# Patient Record
Sex: Male | Born: 1990 | Race: White | Hispanic: No | Marital: Single | Smoking: Current every day smoker
Health system: Southern US, Community
[De-identification: ages and names within clinical notes are randomized; demographics above are authoritative.]

## PROBLEM LIST (undated history)

## (undated) DIAGNOSIS — R569 Unspecified convulsions: Secondary | ICD-10-CM

## (undated) DIAGNOSIS — I499 Cardiac arrhythmia, unspecified: Secondary | ICD-10-CM

## (undated) DIAGNOSIS — J45909 Unspecified asthma, uncomplicated: Secondary | ICD-10-CM

## (undated) DIAGNOSIS — F329 Major depressive disorder, single episode, unspecified: Secondary | ICD-10-CM

## (undated) DIAGNOSIS — F419 Anxiety disorder, unspecified: Secondary | ICD-10-CM

## (undated) DIAGNOSIS — F32A Depression, unspecified: Secondary | ICD-10-CM

---

## 2013-12-13 ENCOUNTER — Encounter (HOSPITAL_COMMUNITY): Payer: Self-pay | Admitting: Emergency Medicine

## 2013-12-13 ENCOUNTER — Emergency Department (HOSPITAL_COMMUNITY)
Admission: EM | Admit: 2013-12-13 | Discharge: 2013-12-13 | Disposition: A | Discharge status: Home / Self Care | Payer: Self-pay | Attending: Emergency Medicine | Admitting: Emergency Medicine

## 2013-12-13 DIAGNOSIS — Y9289 Other specified places as the place of occurrence of the external cause: Secondary | ICD-10-CM | POA: Insufficient documentation

## 2013-12-13 DIAGNOSIS — T1512XA Foreign body in conjunctival sac, left eye, initial encounter: Secondary | ICD-10-CM | POA: Insufficient documentation

## 2013-12-13 DIAGNOSIS — Y9389 Activity, other specified: Secondary | ICD-10-CM | POA: Insufficient documentation

## 2013-12-13 DIAGNOSIS — Z720 Tobacco use: Secondary | ICD-10-CM | POA: Insufficient documentation

## 2013-12-13 DIAGNOSIS — S00252A Superficial foreign body of left eyelid and periocular area, initial encounter: Secondary | ICD-10-CM

## 2013-12-13 MED ORDER — HYDROCODONE-ACETAMINOPHEN 5-325 MG PO TABS: 1.0000 | ORAL_TABLET | Freq: Four times a day (QID) | ORAL | Status: DC | PRN

## 2013-12-13 MED ORDER — FLUORESCEIN SODIUM 1 MG OP STRP
1.0000 | ORAL_STRIP | Freq: Once | OPHTHALMIC | Status: AC
  Administered 2013-12-13: 1 via OPHTHALMIC
  Filled 2013-12-13: qty 1

## 2013-12-13 MED ORDER — SULFACETAMIDE SODIUM 10 % OP SOLN: 2.0000 [drp] | OPHTHALMIC | Status: AC

## 2013-12-13 MED ORDER — TETRACAINE HCL 0.5 % OP SOLN
2.0000 [drp] | Freq: Once | OPHTHALMIC | Status: AC
  Administered 2013-12-13: 2 [drp] via OPHTHALMIC
  Filled 2013-12-13: qty 2

## 2013-12-13 NOTE — Discharge Instructions (Signed)
Return here as needed. Follow up with the eye doctor provided.  °

## 2013-12-13 NOTE — ED Notes (Signed)
Riding in pick up truck and something flew into his left eye. Eye is red and irritated.

## 2013-12-13 NOTE — ED Provider Notes (Signed)
CSN: [id number]     Arrival date & time 12/13/13  1725 History  This chart was scribed for non-physician practitioner, Ebbie Ridgehris Tereso Unangst, PA-C working with Raeford RazorStephen Kohut, MD by Greggory StallionKayla Andersen, ED scribe. This patient was seen in room TR04C/TR04C and the patient's care was started at 5:57 PM.   Chief Complaint  Patient presents with  . Foreign Body in Eye   The history is provided by the patient. No language interpreter was used.   HPI Comments: Melvin Barry is a 23 y.o. male who presents to the Emergency Department complaining of sudden onset left eye pain and redness that started a few hours ago. States he was riding in a pickup truck when what he thinks was a piece of dirt flew into his eye. Reports gradual onset haziness in his vision but denies other visual changes. Looking down worsens the eye pain. Pt has tried rinsing his eye out with no relief. Denies photophobia. Pt does not wear contacts or glasses.   History reviewed. No pertinent past medical history. History reviewed. No pertinent past surgical history. No family history on file. History  Substance Use Topics  . Smoking status: Current Every Day Smoker -- 1.00 packs/day    Types: Cigarettes  . Smokeless tobacco: Not on file  . Alcohol Use: No     Comment: socially    Review of Systems All other systems negative except as documented in the HPI. All pertinent positives and negatives as reviewed in the HPI.  Allergies  Review of patient's allergies indicates no known allergies.  Home Medications   Prior to Admission medications   Not on File   BP 140/72  Pulse 88  Temp(Src) 98.2 F (36.8 C) (Oral)  Resp 18  Ht 5\' 9"  (1.753 m)  Wt 160 lb (72.576 kg)  BMI 23.62 kg/m2  SpO2 98%  Physical Exam  Nursing note and vitals reviewed. Constitutional: He is oriented to person, place, and time. He appears well-developed and well-nourished. No distress.  HENT:  Head: Normocephalic and atraumatic.  Eyes: EOM are normal.  Right conjunctiva is injected.  Right eyelid inverted; foreign body found and removed. Small area of irritation centrally over cornea.   Neck: Neck supple. No tracheal deviation present.  Cardiovascular: Normal rate.   Pulmonary/Chest: Effort normal. No respiratory distress.  Musculoskeletal: Normal range of motion.  Neurological: He is alert and oriented to person, place, and time.  Skin: Skin is warm and dry.  Psychiatric: He has a normal mood and affect. His behavior is normal.    ED Course  Procedures (including critical care time)  DIAGNOSTIC STUDIES: Oxygen Saturation is 98% on RA, normal by my interpretation.    COORDINATION OF CARE: 5:59 PM-Discussed treatment plan which includes checking for corneal abrasion and irrigating eye with pt at bedside and pt agreed to plan. Will discharge pt home with eyedrops and ophthalmology referral.     I personally performed the services described in this documentation, which was scribed in my presence. The recorded information has been reviewed and is accurate.  Carlyle Dollyhristopher W Nimrat Woolworth, PA-C 12/13/13 1846

## 2013-12-16 NOTE — ED Provider Notes (Signed)
Medical screening examination/treatment/procedure(s) were performed by non-physician practitioner and as supervising physician I was immediately available for consultation/collaboration.   EKG Interpretation None       Raeford RazorStephen Dennie Moltz, MD 12/16/13 1857

## 2014-05-20 ENCOUNTER — Emergency Department (HOSPITAL_COMMUNITY): Payer: Self-pay

## 2014-05-20 ENCOUNTER — Encounter (HOSPITAL_COMMUNITY): Payer: Self-pay

## 2014-05-20 ENCOUNTER — Emergency Department (HOSPITAL_COMMUNITY)
Admission: EM | Admit: 2014-05-20 | Discharge: 2014-05-21 | Disposition: A | Discharge status: Home / Self Care | Payer: Self-pay | Attending: Emergency Medicine | Admitting: Emergency Medicine

## 2014-05-20 DIAGNOSIS — F10920 Alcohol use, unspecified with intoxication, uncomplicated: Secondary | ICD-10-CM

## 2014-05-20 DIAGNOSIS — J45909 Unspecified asthma, uncomplicated: Secondary | ICD-10-CM | POA: Insufficient documentation

## 2014-05-20 DIAGNOSIS — R55 Syncope and collapse: Secondary | ICD-10-CM

## 2014-05-20 DIAGNOSIS — F10120 Alcohol abuse with intoxication, uncomplicated: Secondary | ICD-10-CM | POA: Insufficient documentation

## 2014-05-20 DIAGNOSIS — Y998 Other external cause status: Secondary | ICD-10-CM | POA: Insufficient documentation

## 2014-05-20 DIAGNOSIS — Z720 Tobacco use: Secondary | ICD-10-CM | POA: Insufficient documentation

## 2014-05-20 DIAGNOSIS — Y9389 Activity, other specified: Secondary | ICD-10-CM | POA: Insufficient documentation

## 2014-05-20 DIAGNOSIS — W1839XA Other fall on same level, initial encounter: Secondary | ICD-10-CM | POA: Insufficient documentation

## 2014-05-20 DIAGNOSIS — Z8669 Personal history of other diseases of the nervous system and sense organs: Secondary | ICD-10-CM | POA: Insufficient documentation

## 2014-05-20 DIAGNOSIS — Y92009 Unspecified place in unspecified non-institutional (private) residence as the place of occurrence of the external cause: Secondary | ICD-10-CM | POA: Insufficient documentation

## 2014-05-20 DIAGNOSIS — S299XXA Unspecified injury of thorax, initial encounter: Secondary | ICD-10-CM | POA: Insufficient documentation

## 2014-05-20 DIAGNOSIS — T1490XA Injury, unspecified, initial encounter: Secondary | ICD-10-CM

## 2014-05-20 HISTORY — DX: Cardiac arrhythmia, unspecified: I49.9

## 2014-05-20 HISTORY — DX: Unspecified convulsions: R56.9

## 2014-05-20 HISTORY — DX: Unspecified asthma, uncomplicated: J45.909

## 2014-05-20 LAB — I-STAT CHEM 8, ED
BUN: 11 mg/dL (ref 6–23)
Calcium, Ion: 1.06 mmol/L — ABNORMAL LOW (ref 1.12–1.23)
Chloride: 106 mmol/L (ref 96–112)
Creatinine, Ser: 1.2 mg/dL (ref 0.50–1.35)
GLUCOSE: 91 mg/dL (ref 70–99)
HCT: 48 % (ref 39.0–52.0)
Hemoglobin: 16.3 g/dL (ref 13.0–17.0)
Potassium: 4 mmol/L (ref 3.5–5.1)
Sodium: 142 mmol/L (ref 135–145)
TCO2: 18 mmol/L (ref 0–100)

## 2014-05-20 MED ORDER — IBUPROFEN 800 MG PO TABS
800.0000 mg | ORAL_TABLET | Freq: Once | ORAL | Status: AC
  Administered 2014-05-20: 800 mg via ORAL
  Filled 2014-05-20: qty 1

## 2014-05-20 MED ORDER — ACETAMINOPHEN 325 MG PO TABS
650.0000 mg | ORAL_TABLET | Freq: Once | ORAL | Status: AC
  Administered 2014-05-20: 650 mg via ORAL
  Filled 2014-05-20: qty 2

## 2014-05-20 NOTE — ED Provider Notes (Signed)
CSN: [id number]     Arrival date & time    History   First MD Initiated Contact with Patient 05/20/14 2111     Chief Complaint  Patient presents with  . Loss of Consciousness     (Consider location/radiation/quality/duration/timing/severity/associated sxs/prior Treatment) HPI Comments: 24 year old male, history of asthma, history of a single seizure proximally 5 years ago, presents to the hospital by paramedics after he was found to have had a syncopal episode at home while on his deck, states that he went outside to get his dog, the next thing he remembers the paramedics were standing over him and a family member was attempting CPR. He very politely asked the person to stop doing CPR and had a normal mental status at that time. He complains of right-sided chest pain where his friend or family member was performing CPR, he does not have a headache, he has no other complaints. He does state that he has been drinking alcohol heavily tonight.  He denies any prodromal symptoms including palpitations, chest pain, difficulty breathing, blurred vision, weakness, numbness and has had no fevers chills or stiff neck.  Patient is a 24 y.o. male presenting with syncope. The history is provided by the patient and the EMS personnel.  Loss of Consciousness   No past medical history on file. No past surgical history on file. No family history on file. History  Substance Use Topics  . Smoking status: Current Every Day Smoker -- 1.00 packs/day    Types: Cigarettes  . Smokeless tobacco: Not on file  . Alcohol Use: No     Comment: socially    Review of Systems  Cardiovascular: Positive for syncope.  All other systems reviewed and are negative.     Allergies  Review of patient's allergies indicates no known allergies.  Home Medications   Prior to Admission medications   Medication Sig Start Date End Date Taking? Authorizing Provider  HYDROcodone-acetaminophen (NORCO/VICODIN) 5-325 MG per tablet  Take 1 tablet by mouth every 6 (six) hours as needed for moderate pain. 12/13/13   Charlestine Night, PA-C   There were no vitals taken for this visit. Physical Exam  Constitutional: He appears well-developed and well-nourished. No distress.  HENT:  Head: Normocephalic and atraumatic.  Mouth/Throat: Oropharynx is clear and moist. No oropharyngeal exudate.  Normocephalic, atraumatic, clear oropharynx without any swelling, exudate, asymmetry, hypertrophy or erythema.  Moist mucous membranes, clear nasal passages, normal tympanic membranes without any effusions, erythema or opacification.  Eyes: Conjunctivae and EOM are normal. Pupils are equal, round, and reactive to light. Right eye exhibits no discharge. Left eye exhibits no discharge. No scleral icterus.  Neck: Normal range of motion. Neck supple. No JVD present. No thyromegaly present.  Cardiovascular: Normal rate, regular rhythm, normal heart sounds and intact distal pulses.  Exam reveals no gallop and no friction rub.   No murmur heard. Pulmonary/Chest: Effort normal and breath sounds normal. No respiratory distress. He has no wheezes. He has no rales. He exhibits tenderness (no bruising crepitance or subcutaneous emphysema though he does have tenderness over the right lower anterior chest wall).  Abdominal: Soft. Bowel sounds are normal. He exhibits no distension and no mass. There is no tenderness.  Musculoskeletal: Normal range of motion. He exhibits no edema or tenderness.  Speech is clear, cranial nerves III through XII are intact, memory is intact, strength is normal in all 4 extremities including grips, sensation is intact to light touch and pinprick in all 4 extremities. Coordination as tested  by finger-nose-finger is normal, no limb ataxia. Normal gait, normal reflexes at the patellar tendons bilaterally  Lymphadenopathy:    He has no cervical adenopathy.  Neurological: He is alert. Coordination normal.  Skin: Skin is warm and dry.  No rash noted. No erythema.  Psychiatric: He has a normal mood and affect. His behavior is normal.  Nursing note and vitals reviewed.   ED Course  Procedures (including critical care time) Labs Review Labs Reviewed  ETHANOL  I-STAT CHEM 8, ED    Imaging Review No results found.  ED ECG REPORT  I personally interpreted this EKG   Date: 05/20/2014   Rate: 94  Rhythm: normal sinus rhythm  QRS Axis: normal  Intervals: normal  ST/T Wave abnormalities: normal  Conduction Disutrbances:none  Narrative Interpretation:   Old EKG Reviewed: none available   MDM   Final diagnoses:  Syncope and collapse  Trauma    At this time the patient appears to have had some kind of syncopal episode, the etiology is unclear, he will need CT scan imaging, labs and cardiac monitoring. EKG as well, he now has a normal mental status and is laughing about how much alcohol he has consumed this evening.  The patient has been continuously sleeping, he is easily to arouse, moves all 4 extremities without difficulty, vital signs remained in a stable range. There is no hypoxia tachycardia or fever. His friend and roommate now arrives and states that he was there when the patient was having difficulty at home, states that he lost all control of his body and collapse to the ground, was unconscious, he tried to give an albuterol treatment because the patient had said that he was shortness of breath prior to falling, he states that this has happened once in the past when the patient had a seizure as a child though this was only reported by the patient and not witnessed by any friends or family. Alcohol level pending, the patient will need reevaluation after he comes around and sobers up,  Labs show no acute findings thus far, x-ray show no rib fractures, no brain injury, no cervical spine fracture.  Will need tos ober up - be reevaluated by Dr. Deretha EmoryZackowski who will assume care at change of shift     Eber HongBrian Zulema Pulaski,  MD 05/20/14 2350

## 2014-05-20 NOTE — ED Notes (Signed)
MD at bedside. 

## 2014-05-20 NOTE — ED Notes (Signed)
PER EMS:pt from home, family reports the pt suddenly fell out and onto porch and was pulseless according to his gf and she initiated CPR. Pt was A&OX4 upon EMS arrival and upon arrival to ER. Pt reports right rib cage/upper chest pain. No crepitus. Pt reports ETOH (6 drinks and one shot of tequila). Hx of seizures. BP-134/81, HR-102, 99% RA, CBG-89.

## 2014-05-21 LAB — ETHANOL: Alcohol, Ethyl (B): 211 mg/dL — ABNORMAL HIGH (ref 0–9)

## 2014-05-21 MED ORDER — IPRATROPIUM-ALBUTEROL 0.5-2.5 (3) MG/3ML IN SOLN
3.0000 mL | Freq: Once | RESPIRATORY_TRACT | Status: AC
  Administered 2014-05-21: 3 mL via RESPIRATORY_TRACT
  Filled 2014-05-21: qty 3

## 2014-05-21 MED ORDER — ALBUTEROL SULFATE HFA 108 (90 BASE) MCG/ACT IN AERS
1.0000 | INHALATION_SPRAY | Freq: Four times a day (QID) | RESPIRATORY_TRACT | Status: DC | PRN
  Administered 2014-05-21: 1 via RESPIRATORY_TRACT
  Filled 2014-05-21: qty 6.7

## 2014-05-21 MED ORDER — ACETAMINOPHEN 325 MG PO TABS
650.0000 mg | ORAL_TABLET | Freq: Once | ORAL | Status: AC
  Administered 2014-05-21: 650 mg via ORAL
  Filled 2014-05-21: qty 2

## 2014-05-21 NOTE — Discharge Instructions (Signed)
Use albuterol inhaler 1-2 puffs every 6 hours as needed. Return for any new or worse symptoms.

## 2014-05-21 NOTE — ED Provider Notes (Signed)
The patient now awake very functional. Patient in no acute distress nontoxic. Patient did develop some wheezing was given albuterol Atrovent nebulizer wheezing resolved. Patient we discharged home with albuterol inhaler. Patient does use inhaler as needed however the one he had was lost.  Vanetta MuldersScott Ruthia Person, MD 05/21/14 [phone]

## 2014-05-21 NOTE — ED Notes (Signed)
Pt complaining of wheezing and continued difficulty with breathing, R sided wheezing present in the bases upon examination. States EMS lost his inhaler. Short with RN. Unhooked from monitor and encouraged to ambulate.

## 2014-07-07 ENCOUNTER — Emergency Department (HOSPITAL_COMMUNITY)
Admission: EM | Admit: 2014-07-07 | Discharge: 2014-07-07 | Disposition: A | Discharge status: Home / Self Care | Payer: Self-pay | Attending: Emergency Medicine | Admitting: Emergency Medicine

## 2014-07-07 ENCOUNTER — Encounter (HOSPITAL_COMMUNITY): Payer: Self-pay | Admitting: Emergency Medicine

## 2014-07-07 DIAGNOSIS — Z720 Tobacco use: Secondary | ICD-10-CM | POA: Insufficient documentation

## 2014-07-07 DIAGNOSIS — R569 Unspecified convulsions: Secondary | ICD-10-CM | POA: Insufficient documentation

## 2014-07-07 DIAGNOSIS — Z79899 Other long term (current) drug therapy: Secondary | ICD-10-CM | POA: Insufficient documentation

## 2014-07-07 DIAGNOSIS — F1210 Cannabis abuse, uncomplicated: Secondary | ICD-10-CM | POA: Insufficient documentation

## 2014-07-07 DIAGNOSIS — Z8679 Personal history of other diseases of the circulatory system: Secondary | ICD-10-CM | POA: Insufficient documentation

## 2014-07-07 DIAGNOSIS — J45909 Unspecified asthma, uncomplicated: Secondary | ICD-10-CM | POA: Insufficient documentation

## 2014-07-07 LAB — RAPID URINE DRUG SCREEN, HOSP PERFORMED
Amphetamines: NOT DETECTED
Barbiturates: NOT DETECTED
Benzodiazepines: NOT DETECTED
Cocaine: NOT DETECTED
OPIATES: NOT DETECTED
Tetrahydrocannabinol: POSITIVE — AB

## 2014-07-07 LAB — URINALYSIS, ROUTINE W REFLEX MICROSCOPIC
Bilirubin Urine: NEGATIVE
Glucose, UA: NEGATIVE mg/dL
HGB URINE DIPSTICK: NEGATIVE
Ketones, ur: NEGATIVE mg/dL
Leukocytes, UA: NEGATIVE
NITRITE: NEGATIVE
Protein, ur: NEGATIVE mg/dL
Specific Gravity, Urine: 1.017 (ref 1.005–1.030)
Urobilinogen, UA: 0.2 mg/dL (ref 0.0–1.0)
pH: 6 (ref 5.0–8.0)

## 2014-07-07 LAB — HEPATIC FUNCTION PANEL
ALT: 30 U/L (ref 17–63)
AST: 22 U/L (ref 15–41)
Albumin: 4.6 g/dL (ref 3.5–5.0)
Alkaline Phosphatase: 50 U/L (ref 38–126)
BILIRUBIN TOTAL: 0.6 mg/dL (ref 0.3–1.2)
Total Protein: 7.7 g/dL (ref 6.5–8.1)

## 2014-07-07 LAB — CBC
HEMATOCRIT: 45.5 % (ref 39.0–52.0)
HEMOGLOBIN: 15.5 g/dL (ref 13.0–17.0)
MCH: 30.9 pg (ref 26.0–34.0)
MCHC: 34.1 g/dL (ref 30.0–36.0)
MCV: 90.6 fL (ref 78.0–100.0)
PLATELETS: 223 10*3/uL (ref 150–400)
RBC: 5.02 MIL/uL (ref 4.22–5.81)
RDW: 12.7 % (ref 11.5–15.5)
WBC: 7.9 10*3/uL (ref 4.0–10.5)

## 2014-07-07 LAB — BASIC METABOLIC PANEL
ANION GAP: 10 (ref 5–15)
BUN: 12 mg/dL (ref 6–20)
CHLORIDE: 108 mmol/L (ref 101–111)
CO2: 23 mmol/L (ref 22–32)
Calcium: 9.1 mg/dL (ref 8.9–10.3)
Creatinine, Ser: 0.8 mg/dL (ref 0.61–1.24)
GFR calc Af Amer: >60 mL/min (ref >60)
GFR calc non Af Amer: >60 mL/min (ref >60)
Glucose, Bld: 87 mg/dL (ref 70–99)
Potassium: 4.1 mmol/L (ref 3.5–5.1)
Sodium: 141 mmol/L (ref 135–145)

## 2014-07-07 LAB — CBG MONITORING, ED: Glucose-Capillary: 85 mg/dL (ref 70–99)

## 2014-07-07 MED ORDER — SODIUM CHLORIDE 0.9 % IV BOLUS (SEPSIS)
1000.0000 mL | Freq: Once | INTRAVENOUS | Status: AC
  Administered 2014-07-07: 1000 mL via INTRAVENOUS

## 2014-07-07 MED ORDER — LEVETIRACETAM 500 MG PO TABS: 500.0000 mg | ORAL_TABLET | Freq: Two times a day (BID) | ORAL | Status: DC

## 2014-07-07 MED ORDER — KETOROLAC TROMETHAMINE 30 MG/ML IJ SOLN
30.0000 mg | Freq: Once | INTRAMUSCULAR | Status: AC
  Administered 2014-07-07: 30 mg via INTRAVENOUS
  Filled 2014-07-07: qty 1

## 2014-07-07 NOTE — ED Notes (Signed)
Pt was given a urinal and told we needed a sample. Pt stated he does not have to go at this time, but would try shortly.

## 2014-07-07 NOTE — ED Provider Notes (Addendum)
TIME SEEN: 10:50 AM  CHIEF COMPLAINT: Possible seizure  HPI: Pt is a 24 y.o. male with history of childhood seizures who presents emergency department with a possible seizure today. States that he was in a house painting working by himself standing on the floor when he felt lightheaded and slightly short of breath.  Reports the next thing he remembered was that he woke up on the floor. Unclear how long he was on the floor. Denies any chest pain preceding this event. No one was with him when this happened. He was not on a ladder. Reports he woke up with a diffuse throbbing headache this morning but is still present. Reports that he takes Tylenol at home when he has these headaches in the past. No numbness, tingling or focal weakness. Not on anticoagulation. States he did drink alcohol last night but denies using alcohol every day or withdrawal seizures. No drug use. Reports his mother and brothers both have epilepsy. He is not on any antiepileptic. State he had a similar episode in March of this year when he was at a party. States that seizure activity was witnessed at that time. Denies any tongue biting or incontinence today. Denies chest pain or shortness of breath currently. No history of PE or DVT, recent prolonged immobilization such as long flight or hospitalization, fracture, surgery, trauma. No history of premature CAD in his family, hypertension, diabetes, hyperlipidemia.   ROS: See HPI Constitutional: no fever  Eyes: no drainage  ENT: no runny nose   Cardiovascular:  no chest pain  Resp: no SOB  GI: no vomiting GU: no dysuria Integumentary: no rash  Allergy: no hives  Musculoskeletal: no leg swelling  Neurological: no slurred speech ROS otherwise negative  PAST MEDICAL HISTORY/PAST SURGICAL HISTORY:  Past Medical History  Diagnosis Date  . Seizures   . Asthma   . Irregular heart beat     pt unsure what the irregular rhythm is    MEDICATIONS:  Prior to Admission medications    Medication Sig Start Date End Date Taking? Authorizing Provider  albuterol (PROVENTIL HFA;VENTOLIN HFA) 108 (90 BASE) MCG/ACT inhaler Inhale 2 puffs into the lungs every 6 (six) hours as needed for wheezing or shortness of breath.   Yes Historical Provider, MD  HYDROcodone-acetaminophen (NORCO/VICODIN) 5-325 MG per tablet Take 1 tablet by mouth every 6 (six) hours as needed for moderate pain. Patient not taking: Reported on 07/07/2014 12/13/13   Charlestine Nighthristopher Lawyer, PA-C    ALLERGIES:  No Known Allergies  SOCIAL HISTORY:  History  Substance Use Topics  . Smoking status: Current Every Day Smoker -- 1.00 packs/day    Types: Cigarettes  . Smokeless tobacco: Not on file  . Alcohol Use: Yes     Comment: socially    FAMILY HISTORY: No family history on file.  EXAM: BP 139/78 mmHg  Pulse 87  Temp(Src) 97.7 F (36.5 C) (Oral)  Resp 18  Ht 5\' 9"  (1.753 m)  Wt 175 lb (79.379 kg)  BMI 25.83 kg/m2  SpO2 99% CONSTITUTIONAL: Alert and oriented and responds appropriately to questions. Well-appearing; well-nourished HEAD: Normocephalic, atraumatic EYES: Conjunctivae clear, PERRL, photophobia ENT: normal nose; no rhinorrhea; moist mucous membranes; pharynx without lesions noted, no lesions on the tongue, no dental injury, no septal hematoma NECK: Supple, no meningismus, no LAD; no midline cervical spine tenderness or step-off or deformity CARD: RRR; S1 and S2 appreciated; no murmurs, no clicks, no rubs, no gallops RESP: Normal chest excursion without splinting or tachypnea; breath sounds  clear and equal bilaterally; no wheezes, no rhonchi, no rales, chest wall stable and nontender to palpation ABD/GI: Normal bowel sounds; non-distended; soft, non-tender, no rebound, no guarding BACK:  The back appears normal and is non-tender to palpation, there is no CVA tenderness, no midline spinal tenderness or step-off or deformity EXT: Normal ROM in all joints; non-tender to palpation; no edema; normal  capillary refill; no cyanosis    SKIN: Normal color for age and race; warm NEURO: Moves all extremities equally, sensation to light touch intact diffusely, cranial nerves II through XII intact PSYCH: The patient's mood and manner are appropriate. Grooming and personal hygiene are appropriate.  MEDICAL DECISION MAKING: Patient here with possible seizure versus syncope. EKG shows no ischemic changes. He does have a h/o sinus arrhythmia. No interval changes. Has a history of epilepsy as a child and his family has significant history. I suspect this was a seizure. Will check labs, urine. When he was here in March he had a normal head CT. Have discussed with patient at length that he will need to follow-up with a neurologist and likely be started on antiepileptics. Have also discussed with him that he cannot drive until he is cleared by a neurologist. 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  ED PROGRESS: Patient's workup today has been unremarkable. Normal labs and urine.  D/w Dr. Hosie PoissonSumner with neural hospitalist service who recommends placing the patient on Keppra 500 mg twice a day. I have found a coupon for this medication were he can get it out [facility] for $16. Have discussed with him again at length that he cannot drive until he has been cleared by neurologist. Discussed other seizure precautions. Given outpatient neurology follow-up. He verbalized understanding and is comfortable with this plan.     EKG Interpretation  Date/Time:  Wednesday Jul 07 2014 10:39:13 EDT Ventricular Rate:  85 PR Interval:  159 QRS Duration: 90 QT Interval:  347 QTC Calculation: 413 R Axis:   57 Text Interpretation:  Sinus rhythm Confirmed by ZACKOWSKI  MD, SCOTT ([id number]) on 07/07/2014 10:52:02 AM         Layla MawKristen N Ward, DO 07/07/14 1227   Pt's headache is gone after Toradol.  Layla MawKristen N Ward, DO 07/07/14 1238

## 2014-07-07 NOTE — ED Notes (Signed)
Bed: [id number] Expected date:  Expected time:  Means of arrival:  Comments: 24 y/o seizure

## 2014-07-07 NOTE — ED Notes (Addendum)
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 Pt reports headache but denies other symptoms at present time. Pt continues to reports hx of seizure a month and a half ago and assumes same today. Pt has not eaten today.

## 2014-07-07 NOTE — ED Notes (Signed)
Awake. Verbally responsive. A/O x4. Resp even and unlabored. No audible adventitious breath sounds noted. ABC's intact.  

## 2014-07-07 NOTE — Discharge Instructions (Signed)
Seizure, Adult °A seizure is abnormal electrical activity in the brain. Seizures usually last from 30 seconds to 2 minutes. There are various types of seizures. °Before a seizure, you may have a warning sensation (aura) that a seizure is about to occur. An aura may include the following symptoms:  °· Fear or anxiety. °· Nausea. °· Feeling like the room is spinning (vertigo). °· Vision changes, such as seeing flashing lights or spots. °Common symptoms during a seizure include: °· A change in attention or behavior (altered mental status). °· Convulsions with rhythmic jerking movements. °· Drooling. °· Rapid eye movements. °· Grunting. °· Loss of bladder and bowel control. °· Bitter taste in the mouth. °· Tongue biting. °After a seizure, you may feel confused and sleepy. You may also have an injury resulting from convulsions during the seizure. °HOME CARE INSTRUCTIONS  °· If you are given medicines, take them exactly as prescribed by your health care provider. °· Keep all follow-up appointments as directed by your health care provider. °· Do not swim or drive or engage in risky activity during which a seizure could cause further injury to you or others until your health care provider says it is OK. °· Get adequate rest. °· Teach friends and family what to do if you have a seizure. They should: °· Lay you on the ground to prevent a fall. °· Put a cushion under your head. °· Loosen any tight clothing around your neck. °· Turn you on your side. If vomiting occurs, this helps keep your airway clear. °· Stay with you until you recover. °· Know whether or not you need emergency care. °SEEK IMMEDIATE MEDICAL CARE IF: °· The seizure lasts longer than 5 minutes. °· The seizure is severe or you do not wake up immediately after the seizure. °· You have an altered mental status after the seizure. °· You are having more frequent or worsening seizures. °Someone should drive you to the emergency department or call local emergency  services (911 in [place] °MAKE SURE YOU: °· Understand these instructions. °· Will watch your condition. °· Will get help right away if you are not doing well or get worse. °Document Released: 02/17/2000 Document Revised: 12/10/2012 Document Reviewed: 10/01/2012 °[employer]® Patient Information ©2015 [employer]. This information is not intended to replace advice given to you by your health care provider. Make sure you discuss any questions you have with your health care provider. ° °Driving and Equipment Restrictions °Some medical problems make it dangerous to drive, ride a bike, or use machines. Some of these problems are: °· A hard blow to the head (concussion). °· Passing out (fainting). °· Twitching and shaking (seizures). °· Low blood sugar. °· Taking medicine to help you relax (sedatives). °· Taking pain medicines. °· Wearing an eye patch. °· Wearing splints. This can make it hard to use parts of your body that you need to drive safely. °HOME CARE  °· Do not drive until your doctor says it is okay. °· Do not use machines until your doctor says it is okay. °You may need a form signed by your doctor (medical release) before you can drive again. You may also need this form before you do other tasks where you need to be fully alert. °MAKE SURE YOU: °· Understand these instructions. °· Will watch your condition. °· Will get help right away if you are not doing well or get worse. °Document Released: 03/29/2004 Document Revised: 05/14/2011 Document Reviewed: 06/29/2009 °[employer]® Patient Information ©2015 [employer].   This information is not intended to replace advice given to you by your health care provider. Make sure you discuss any questions you have with your health care provider. ° °

## 2014-10-15 ENCOUNTER — Encounter (HOSPITAL_COMMUNITY): Payer: Self-pay | Admitting: Emergency Medicine

## 2014-10-15 ENCOUNTER — Emergency Department (HOSPITAL_COMMUNITY): Payer: Self-pay

## 2014-10-15 ENCOUNTER — Emergency Department (HOSPITAL_COMMUNITY)
Admission: EM | Admit: 2014-10-15 | Discharge: 2014-10-15 | Disposition: A | Discharge status: Home / Self Care | Payer: Self-pay | Attending: Emergency Medicine | Admitting: Emergency Medicine

## 2014-10-15 DIAGNOSIS — J45909 Unspecified asthma, uncomplicated: Secondary | ICD-10-CM | POA: Insufficient documentation

## 2014-10-15 DIAGNOSIS — Y9289 Other specified places as the place of occurrence of the external cause: Secondary | ICD-10-CM | POA: Insufficient documentation

## 2014-10-15 DIAGNOSIS — Z79899 Other long term (current) drug therapy: Secondary | ICD-10-CM | POA: Insufficient documentation

## 2014-10-15 DIAGNOSIS — Z23 Encounter for immunization: Secondary | ICD-10-CM | POA: Insufficient documentation

## 2014-10-15 DIAGNOSIS — Y998 Other external cause status: Secondary | ICD-10-CM | POA: Insufficient documentation

## 2014-10-15 DIAGNOSIS — G40909 Epilepsy, unspecified, not intractable, without status epilepticus: Secondary | ICD-10-CM | POA: Insufficient documentation

## 2014-10-15 DIAGNOSIS — Y9389 Activity, other specified: Secondary | ICD-10-CM | POA: Insufficient documentation

## 2014-10-15 DIAGNOSIS — S61411A Laceration without foreign body of right hand, initial encounter: Secondary | ICD-10-CM | POA: Insufficient documentation

## 2014-10-15 DIAGNOSIS — Z720 Tobacco use: Secondary | ICD-10-CM | POA: Insufficient documentation

## 2014-10-15 MED ORDER — CEPHALEXIN 250 MG PO CAPS
500.0000 mg | ORAL_CAPSULE | Freq: Once | ORAL | Status: AC
  Administered 2014-10-15: 500 mg via ORAL
  Filled 2014-10-15: qty 2

## 2014-10-15 MED ORDER — TETANUS-DIPHTH-ACELL PERTUSSIS 5-2.5-18.5 LF-MCG/0.5 IM SUSP
0.5000 mL | Freq: Once | INTRAMUSCULAR | Status: AC
  Administered 2014-10-15: 0.5 mL via INTRAMUSCULAR
  Filled 2014-10-15: qty 0.5

## 2014-10-15 MED ORDER — LIDOCAINE HCL (PF) 1 % IJ SOLN
5.0000 mL | Freq: Once | INTRAMUSCULAR | Status: AC
  Administered 2014-10-15: 5 mL via INTRADERMAL
  Filled 2014-10-15: qty 5

## 2014-10-15 MED ORDER — CEPHALEXIN 500 MG PO CAPS: 500.0000 mg | ORAL_CAPSULE | Freq: Three times a day (TID) | ORAL | Status: DC
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
## 2014-10-15 NOTE — Discharge Instructions (Signed)
Take ibuprofen as needed for pain. Follow up in 7 days for suture removal or sooner for problems.
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
## 2014-12-09 ENCOUNTER — Emergency Department (HOSPITAL_COMMUNITY)
Admission: EM | Admit: 2014-12-09 | Discharge: 2014-12-09 | Disposition: A | Discharge status: Home / Self Care | Payer: Self-pay | Attending: Emergency Medicine | Admitting: Emergency Medicine

## 2014-12-09 ENCOUNTER — Emergency Department (HOSPITAL_COMMUNITY): Payer: Self-pay

## 2014-12-09 ENCOUNTER — Encounter (HOSPITAL_COMMUNITY): Payer: Self-pay | Admitting: Emergency Medicine

## 2014-12-09 DIAGNOSIS — Y9389 Activity, other specified: Secondary | ICD-10-CM | POA: Insufficient documentation

## 2014-12-09 DIAGNOSIS — R040 Epistaxis: Secondary | ICD-10-CM | POA: Insufficient documentation

## 2014-12-09 DIAGNOSIS — S29001A Unspecified injury of muscle and tendon of front wall of thorax, initial encounter: Secondary | ICD-10-CM | POA: Insufficient documentation

## 2014-12-09 DIAGNOSIS — T07XXXA Unspecified multiple injuries, initial encounter: Secondary | ICD-10-CM

## 2014-12-09 DIAGNOSIS — T148 Other injury of unspecified body region: Secondary | ICD-10-CM | POA: Insufficient documentation

## 2014-12-09 DIAGNOSIS — Z720 Tobacco use: Secondary | ICD-10-CM | POA: Insufficient documentation

## 2014-12-09 DIAGNOSIS — Z79899 Other long term (current) drug therapy: Secondary | ICD-10-CM | POA: Insufficient documentation

## 2014-12-09 DIAGNOSIS — S4991XA Unspecified injury of right shoulder and upper arm, initial encounter: Secondary | ICD-10-CM | POA: Insufficient documentation

## 2014-12-09 DIAGNOSIS — S0993XA Unspecified injury of face, initial encounter: Secondary | ICD-10-CM | POA: Insufficient documentation

## 2014-12-09 DIAGNOSIS — J45909 Unspecified asthma, uncomplicated: Secondary | ICD-10-CM | POA: Insufficient documentation

## 2014-12-09 DIAGNOSIS — Y998 Other external cause status: Secondary | ICD-10-CM | POA: Insufficient documentation

## 2014-12-09 DIAGNOSIS — Y9289 Other specified places as the place of occurrence of the external cause: Secondary | ICD-10-CM | POA: Insufficient documentation

## 2014-12-09 DIAGNOSIS — G40909 Epilepsy, unspecified, not intractable, without status epilepticus: Secondary | ICD-10-CM | POA: Insufficient documentation

## 2014-12-09 MED ORDER — HYDROMORPHONE HCL 1 MG/ML IJ SOLN
1.0000 mg | Freq: Once | INTRAMUSCULAR | Status: AC
  Administered 2014-12-09: 1 mg via INTRAMUSCULAR
  Filled 2014-12-09: qty 1

## 2014-12-09 MED ORDER — IBUPROFEN 400 MG PO TABS
600.0000 mg | ORAL_TABLET | Freq: Once | ORAL | Status: AC
  Administered 2014-12-09: 600 mg via ORAL
  Filled 2014-12-09 (×2): qty 1

## 2014-12-09 MED ORDER — DIAZEPAM 5 MG PO TABS
5.0000 mg | ORAL_TABLET | Freq: Once | ORAL | Status: AC
  Administered 2014-12-09: 5 mg via ORAL
  Filled 2014-12-09: qty 1

## 2014-12-09 MED ORDER — OXYCODONE-ACETAMINOPHEN 5-325 MG PO TABS: 1.0000 | ORAL_TABLET | ORAL | Status: DC | PRN
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
## 2014-12-09 NOTE — ED Notes (Signed)
Dr. Gwendolyn Grant notified of pt and orders received.
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
## 2015-02-21 ENCOUNTER — Emergency Department (HOSPITAL_COMMUNITY): Payer: Self-pay

## 2015-02-21 ENCOUNTER — Emergency Department (HOSPITAL_COMMUNITY)
Admission: EM | Admit: 2015-02-21 | Discharge: 2015-02-22 | Disposition: A | Discharge status: Home / Self Care | Payer: Self-pay | Attending: Emergency Medicine | Admitting: Emergency Medicine

## 2015-02-21 ENCOUNTER — Encounter (HOSPITAL_COMMUNITY): Payer: Self-pay

## 2015-02-21 DIAGNOSIS — Y93E1 Activity, personal bathing and showering: Secondary | ICD-10-CM | POA: Insufficient documentation

## 2015-02-21 DIAGNOSIS — S29002A Unspecified injury of muscle and tendon of back wall of thorax, initial encounter: Secondary | ICD-10-CM | POA: Insufficient documentation

## 2015-02-21 DIAGNOSIS — J45909 Unspecified asthma, uncomplicated: Secondary | ICD-10-CM | POA: Insufficient documentation

## 2015-02-21 DIAGNOSIS — Z79899 Other long term (current) drug therapy: Secondary | ICD-10-CM | POA: Insufficient documentation

## 2015-02-21 DIAGNOSIS — F17210 Nicotine dependence, cigarettes, uncomplicated: Secondary | ICD-10-CM | POA: Insufficient documentation

## 2015-02-21 DIAGNOSIS — R569 Unspecified convulsions: Secondary | ICD-10-CM | POA: Insufficient documentation

## 2015-02-21 DIAGNOSIS — Z8679 Personal history of other diseases of the circulatory system: Secondary | ICD-10-CM | POA: Insufficient documentation

## 2015-02-21 DIAGNOSIS — W182XXA Fall in (into) shower or empty bathtub, initial encounter: Secondary | ICD-10-CM | POA: Insufficient documentation

## 2015-02-21 DIAGNOSIS — Y92091 Bathroom in other non-institutional residence as the place of occurrence of the external cause: Secondary | ICD-10-CM | POA: Insufficient documentation

## 2015-02-21 DIAGNOSIS — Y998 Other external cause status: Secondary | ICD-10-CM | POA: Insufficient documentation

## 2015-02-21 LAB — I-STAT CHEM 8, ED
BUN: 13 mg/dL (ref 6–20)
CREATININE: 1 mg/dL (ref 0.61–1.24)
Calcium, Ion: 1.09 mmol/L — ABNORMAL LOW (ref 1.12–1.23)
Chloride: 108 mmol/L (ref 101–111)
Glucose, Bld: 95 mg/dL (ref 65–99)
HEMATOCRIT: 44 % (ref 39.0–52.0)
HEMOGLOBIN: 15 g/dL (ref 13.0–17.0)
POTASSIUM: 3.8 mmol/L (ref 3.5–5.1)
SODIUM: 143 mmol/L (ref 135–145)
TCO2: 21 mmol/L (ref 0–100)

## 2015-02-21 LAB — CBG MONITORING, ED: GLUCOSE-CAPILLARY: 95 mg/dL (ref 65–99)

## 2015-02-21 MED ORDER — LORAZEPAM 2 MG/ML IJ SOLN
1.0000 mg | Freq: Once | INTRAMUSCULAR | Status: AC
  Administered 2015-02-21: 1 mg via INTRAVENOUS
  Filled 2015-02-21: qty 1

## 2015-02-21 MED ORDER — LEVETIRACETAM 500 MG PO TABS: 500.0000 mg | ORAL_TABLET | Freq: Two times a day (BID) | ORAL | Status: AC

## 2015-02-21 MED ORDER — ACETAMINOPHEN 325 MG PO TABS
650.0000 mg | ORAL_TABLET | Freq: Once | ORAL | Status: AC
  Administered 2015-02-21: 650 mg via ORAL
  Filled 2015-02-21: qty 2

## 2015-02-21 MED ORDER — LORAZEPAM 2 MG/ML IJ SOLN
1.0000 mg | Freq: Once | INTRAMUSCULAR | Status: AC
  Administered 2015-02-22: 1 mg via INTRAVENOUS
  Filled 2015-02-21: qty 1

## 2015-02-21 MED ORDER — SODIUM CHLORIDE 0.9 % IV SOLN
1000.0000 mg | Freq: Once | INTRAVENOUS | Status: AC
  Administered 2015-02-21: 1000 mg via INTRAVENOUS
  Filled 2015-02-21: qty 5
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
## 2015-02-21 NOTE — ED Notes (Signed)
Bed: WA06 Expected date:  Expected time:  Means of arrival:  Comments: EMS 24 yo male seizure/hx seizure-alert at present time

## 2015-02-21 NOTE — Discharge Instructions (Signed)
Epilepsy °Epilepsy is a disorder in which a person has repeated seizures over time. A seizure is a release of abnormal electrical activity in the brain. Seizures can cause a change in attention, behavior, or the ability to remain awake and alert (altered mental status). Seizures often involve uncontrollable shaking (convulsions).  °Most people with epilepsy lead normal lives. However, people with epilepsy are at an increased risk of falls, accidents, and injuries. Therefore, it is important to begin treatment right away. °CAUSES  °Epilepsy has many possible causes. Anything that disturbs the normal pattern of brain cell activity can lead to seizures. This may include:  °· Head injury. °· Birth trauma. °· High fever as a child. °· Stroke. °· Bleeding into or around the brain. °· Certain drugs. °· Prolonged low oxygen, such as what occurs after CPR efforts. °· Abnormal brain development. °· Certain illnesses, such as meningitis, encephalitis (brain infection), malaria, and other infections. °· An imbalance of nerve signaling chemicals (neurotransmitters).   °SIGNS AND SYMPTOMS  °The symptoms of a seizure can vary greatly from one person to another. Right before a seizure, you may have a warning (aura) that a seizure is about to occur. An aura may include the following symptoms: °· Fear or anxiety. °· Nausea. °· Feeling like the room is spinning (vertigo). °· Vision changes, such as seeing flashing lights or spots. °Common symptoms during a seizure include: °· Abnormal sensations, such as an abnormal smell or a bitter taste in the mouth.   °· Sudden, general body stiffness.   °· Convulsions that involve rhythmic jerking of the face, arm, or leg on one or both sides.   °· Sudden change in consciousness.   °¨ Appearing to be awake but not responding.   °¨ Appearing to be asleep but cannot be awakened.   °· Grimacing, chewing, lip smacking, drooling, tongue biting, or loss of bowel or bladder control. °After a seizure,  you may feel sleepy for a while.  °DIAGNOSIS  °Your health care provider will ask about your symptoms and take a medical history. Descriptions from any witnesses to your seizures will be very helpful in the diagnosis. A physical exam, including a detailed neurological exam, is necessary. Various tests may be done, such as:  °· An electroencephalogram (EEG). This is a painless test of your brain waves. In this test, a diagram is created of your brain waves. These diagrams can be interpreted by a specialist. °· An MRI of the brain.   °· A CT scan of the brain.   °· A spinal tap (lumbar puncture, LP). °· Blood tests to check for signs of infection or abnormal blood chemistry. °TREATMENT  °There is no cure for epilepsy, but it is generally treatable. Once epilepsy is diagnosed, it is important to begin treatment as soon as possible. For most people with epilepsy, seizures can be controlled with medicines. The following may also be used: °· A pacemaker for the brain (vagus nerve stimulator) can be used for people with seizures that are not well controlled by medicine. °· Surgery on the brain. °For some people, epilepsy eventually goes away. °HOME CARE INSTRUCTIONS  °· Follow your health care provider's recommendations on driving and safety in normal activities. °· Get enough rest. Lack of sleep can cause seizures. °· Only take over-the-counter or prescription medicines as directed by your health care provider. Take any prescribed medicine exactly as directed. °· Avoid any known triggers of your seizures. °· Keep a seizure diary. Record what you recall about any seizure, especially any possible trigger.   °· Make   sure the people you live and work with know that you are prone to seizures. They should receive instructions on how to help you. In general, a witness to a seizure should:   °¨ Cushion your head and body.   °¨ Turn you on your side.   °¨ Avoid unnecessarily restraining you.   °¨ Not place anything inside your  mouth.   °¨ Call for emergency medical help if there is any question about what has occurred.   °· Follow up with your health care provider as directed. You may need regular blood tests to monitor the levels of your medicine.   °SEEK MEDICAL CARE IF:  °· You develop signs of infection or other illness. This might increase the risk of a seizure.   °· You seem to be having more frequent seizures.   °· Your seizure pattern is changing.   °SEEK IMMEDIATE MEDICAL CARE IF:  °· You have a seizure that does not stop after a few moments.   °· You have a seizure that causes any difficulty in breathing.   °· You have a seizure that results in a very severe headache.   °· You have a seizure that leaves you with the inability to speak or use a part of your body.   °  °This information is not intended to replace advice given to you by your health care provider. Make sure you discuss any questions you have with your health care provider. °  °Document Released: 02/19/2005 Document Revised: 12/10/2012 Document Reviewed: 10/01/2012 °Elsevier Interactive Patient Education ©2016 [employer] ° ° °Seizure, Adult °A seizure is abnormal electrical activity in the brain. Seizures usually last from 30 seconds to 2 minutes. There are various types of seizures. °Before a seizure, you may have a warning sensation (aura) that a seizure is about to occur. An aura may include the following symptoms:  °· Fear or anxiety. °· Nausea. °· Feeling like the room is spinning (vertigo). °· Vision changes, such as seeing flashing lights or spots. °Common symptoms during a seizure include: °· A change in attention or behavior (altered mental status). °· Convulsions with rhythmic jerking movements. °· Drooling. °· Rapid eye movements. °· Grunting. °· Loss of bladder and bowel control. °· Bitter taste in the mouth. °· Tongue biting. °After a seizure, you may feel confused and sleepy. You may also have an injury resulting from convulsions during the  seizure. °HOME CARE INSTRUCTIONS  °· If you are given medicines, take them exactly as prescribed by your health care provider. °· Keep all follow-up appointments as directed by your health care provider. °· Do not swim or drive or engage in risky activity during which a seizure could cause further injury to you or others until your health care provider says it is OK. °· Get adequate rest. °· Teach friends and family what to do if you have a seizure. They should: °¨ Lay you on the ground to prevent a fall. °¨ Put a cushion under your head. °¨ Loosen any tight clothing around your neck. °¨ Turn you on your side. If vomiting occurs, this helps keep your airway clear. °¨ Stay with you until you recover. °¨ Know whether or not you need emergency care. °SEEK IMMEDIATE MEDICAL CARE IF: °· The seizure lasts longer than 5 minutes. °· The seizure is severe or you do not wake up immediately after the seizure. °· You have an altered mental status after the seizure. °· You are having more frequent or worsening seizures. °Someone should drive you to the emergency department or call local emergency   services (911 in [place] °MAKE SURE YOU: °· Understand these instructions. °· Will watch your condition. °· Will get help right away if you are not doing well or get worse. °  °This information is not intended to replace advice given to you by your health care provider. Make sure you discuss any questions you have with your health care provider. °  °Document Released: 02/17/2000 Document Revised: 03/12/2014 Document Reviewed: 10/01/2012 °Elsevier Interactive Patient Education ©2016 Elsevier Inc. ° °

## 2015-02-21 NOTE — ED Provider Notes (Signed)
CSN: [id number]     Arrival date & time 02/21/15  2133 History   First MD Initiated Contact with Patient 02/21/15 2209     Chief Complaint  Patient presents with  . Seizures     (Consider location/radiation/quality/duration/timing/severity/associated sxs/prior Treatment) HPI Comments: Patient here after having a seizure that was unwitnessed prior to arrival. Does have a history of seizures and is unsure why. Patient has not been compliant with his Keppra for the past 2 months. Denies any recent alcohol or drug use. When he felt a day he was in the bathroom and did strike his head and neck against the door. Denies any upper or lower extremity weakness. Does complain of diffuse upper back spasms. No tongue or bladder dysfunction. EMS was called and patient CBG was 112 and patient transported here  Patient is a 24 y.o. male presenting with seizures. The history is provided by the patient.  Seizures   Past Medical History  Diagnosis Date  . Seizures (HCC)   . Asthma   . Irregular heart beat     pt unsure what the irregular rhythm is   History reviewed. No pertinent past surgical history. History reviewed. No pertinent family history. Social History  Substance Use Topics  . Smoking status: Current Every Day Smoker -- 1.00 packs/day    Types: Cigarettes  . Smokeless tobacco: None  . Alcohol Use: Yes     Comment: socially    Review of Systems  Neurological: Positive for seizures.  All other systems reviewed and are negative.     Allergies  Review of patient's allergies indicates no known allergies.  Home Medications   Prior to Admission medications   Medication Sig Start Date End Date Taking? Authorizing Provider  albuterol (PROVENTIL HFA;VENTOLIN HFA) 108 (90 BASE) MCG/ACT inhaler Inhale 2 puffs into the lungs every 6 (six) hours as needed for wheezing or shortness of breath.   Yes Historical Provider, MD  levETIRAcetam (KEPPRA) 500 MG tablet Take 1 tablet (500 mg total)  by mouth 2 (two) times daily. 07/07/14  Yes Kristen N Ward, DO  oxyCODONE-acetaminophen (PERCOCET/ROXICET) 5-325 MG tablet Take 1 tablet by mouth every 4 (four) hours as needed for severe pain. Patient not taking: Reported on 02/21/2015 12/09/14   Raeford Razor, MD   BP 137/79 mmHg  Pulse 91  Temp(Src) 98.5 F (36.9 C) (Oral)  Resp 15  SpO2 98% Physical Exam  Constitutional: He is oriented to person, place, and time. He appears well-developed and well-nourished.  Non-toxic appearance. No distress.  HENT:  Head: Normocephalic and atraumatic.  Eyes: Conjunctivae, EOM and lids are normal. Pupils are equal, round, and reactive to light.  Neck: Normal range of motion. Neck supple. No tracheal deviation present. No thyroid mass present.  Cardiovascular: Normal rate, regular rhythm and normal heart sounds.  Exam reveals no gallop.   No murmur heard. Pulmonary/Chest: Effort normal and breath sounds normal. No stridor. No respiratory distress. He has no decreased breath sounds. He has no wheezes. He has no rhonchi. He has no rales.  Abdominal: Soft. Normal appearance and bowel sounds are normal. He exhibits no distension. There is no tenderness. There is no rebound and no CVA tenderness.  Musculoskeletal: Normal range of motion. He exhibits no edema or tenderness.       Arms: Neurological: He is alert and oriented to person, place, and time. He has normal strength. No cranial nerve deficit or sensory deficit. GCS eye subscore is 4. GCS verbal subscore is 5. GCS  motor subscore is 6.  Skin: Skin is warm and dry. No abrasion and no rash noted.  Psychiatric: He has a normal mood and affect. His speech is normal and behavior is normal.  Nursing note and vitals reviewed.   ED Course  Procedures (including critical care time) Labs Review Labs Reviewed  CBG MONITORING, ED  I-STAT CHEM 8, ED    Imaging Review No results found. I have personally reviewed and evaluated these images and lab results as  part of my medical decision-making.   EKG Interpretation None      MDM   Final diagnoses:  None   patient given Ativan IV push 2 here. No seizure activity noted. Head CT and neck to within normal limits. Given 1 g of Keppra IV and prescription for same as well as follow-up with neurology    Lorre NickAnthony Shona Pardo, MD 02/21/15 [phone]

## 2015-02-22 NOTE — ED Notes (Addendum)
Pt requesting to see Dr Freida BusmanAllen before he leaves. Providers made aware. Pt made aware that Dr Freida BusmanAllen has left for the night.

## 2015-04-03 ENCOUNTER — Emergency Department (HOSPITAL_COMMUNITY): Payer: Self-pay

## 2015-04-03 ENCOUNTER — Encounter (HOSPITAL_COMMUNITY): Payer: Self-pay | Admitting: *Deleted

## 2015-04-03 ENCOUNTER — Emergency Department (HOSPITAL_COMMUNITY)
Admission: EM | Admit: 2015-04-03 | Discharge: 2015-04-03 | Disposition: A | Discharge status: Home / Self Care | Payer: Self-pay | Attending: Emergency Medicine | Admitting: Emergency Medicine

## 2015-04-03 DIAGNOSIS — Z8679 Personal history of other diseases of the circulatory system: Secondary | ICD-10-CM | POA: Insufficient documentation

## 2015-04-03 DIAGNOSIS — Z79899 Other long term (current) drug therapy: Secondary | ICD-10-CM | POA: Insufficient documentation

## 2015-04-03 DIAGNOSIS — F17210 Nicotine dependence, cigarettes, uncomplicated: Secondary | ICD-10-CM | POA: Insufficient documentation

## 2015-04-03 DIAGNOSIS — J45901 Unspecified asthma with (acute) exacerbation: Secondary | ICD-10-CM | POA: Insufficient documentation

## 2015-04-03 MED ORDER — IPRATROPIUM BROMIDE 0.02 % IN SOLN
0.5000 mg | Freq: Once | RESPIRATORY_TRACT | Status: AC
  Administered 2015-04-03: 0.5 mg via RESPIRATORY_TRACT
  Filled 2015-04-03: qty 2.5

## 2015-04-03 MED ORDER — DEXAMETHASONE SODIUM PHOSPHATE 10 MG/ML IJ SOLN
10.0000 mg | Freq: Once | INTRAMUSCULAR | Status: AC
  Administered 2015-04-03: 10 mg via INTRAMUSCULAR
  Filled 2015-04-03: qty 1

## 2015-04-03 MED ORDER — ALBUTEROL SULFATE (2.5 MG/3ML) 0.083% IN NEBU
5.0000 mg | INHALATION_SOLUTION | Freq: Once | RESPIRATORY_TRACT | Status: AC
  Administered 2015-04-03: 5 mg via RESPIRATORY_TRACT
  Filled 2015-04-03: qty 6

## 2015-04-03 MED ORDER — ALBUTEROL SULFATE HFA 108 (90 BASE) MCG/ACT IN AERS
2.0000 | INHALATION_SPRAY | Freq: Once | RESPIRATORY_TRACT | Status: AC
  Administered 2015-04-03: 2 via RESPIRATORY_TRACT
  Filled 2015-04-03: qty 6.7

## 2015-04-03 NOTE — ED Provider Notes (Signed)
CSN: [id number]     Arrival date & time 04/03/15  0330 History  By signing my name below, I, Freida Busman, attest that this documentation has been prepared under the direction and in the presence of Tomasita Crumble, MD . Electronically Signed: Freida Busman, Scribe. 04/03/2015. 4:08 AM.    Chief Complaint  Patient presents with  . Asthma    The history is provided by the patient. No language interpreter was used.   HPI Comments:  Melvin Barry is a 25 y.o. male who presents to the Emergency Department complaining of increased SOB x this AM. He has been experiencing the SOB x a few days.  He reports associated productive cough with green sputum. Pt notes his symptoms today are similar to past asthma exacerbations; notes his asthma is often triggered in the winter time. He tried to use his rescue inhaler but it was out. He denies sick contacts at home and fever. No alleviating factors noted.  Past Medical History  Diagnosis Date  . Seizures (HCC)   . Asthma   . Irregular heart beat     pt unsure what the irregular rhythm is   History reviewed. No pertinent past surgical history. No family history on file. Social History  Substance Use Topics  . Smoking status: Current Every Day Smoker -- 1.00 packs/day    Types: Cigarettes  . Smokeless tobacco: None  . Alcohol Use: Yes     Comment: socially    Review of Systems  10 systems reviewed and all are negative for acute change except as noted in the HPI.   Allergies  Review of patient's allergies indicates no known allergies.  Home Medications   Prior to Admission medications   Medication Sig Start Date End Date Taking? Authorizing Provider  albuterol (PROVENTIL HFA;VENTOLIN HFA) 108 (90 BASE) MCG/ACT inhaler Inhale 2 puffs into the lungs every 6 (six) hours as needed for wheezing or shortness of breath.    Historical Provider, MD  levETIRAcetam (KEPPRA) 500 MG tablet Take 1 tablet (500 mg total) by mouth 2 (two) times daily.  02/21/15   Lorre Nick, MD  oxyCODONE-acetaminophen (PERCOCET/ROXICET) 5-325 MG tablet Take 1 tablet by mouth every 4 (four) hours as needed for severe pain. Patient not taking: Reported on 02/21/2015 12/09/14   Raeford Razor, MD   BP 140/80 mmHg  Pulse 79  Temp(Src) 98.3 F (36.8 C) (Oral)  Resp 16  Ht  (1.753 m)  Wt 181 lb (82.101 kg)  BMI 26.72 kg/m2  SpO2 97% Physical Exam  Constitutional: He is oriented to person, place, and time. Vital signs are normal. He appears well-developed and well-nourished.  Non-toxic appearance. He does not appear ill. No distress.  HENT:  Head: Normocephalic and atraumatic.  Nose: Nose normal.  Mouth/Throat: Oropharynx is clear and moist. No oropharyngeal exudate.  Eyes: Conjunctivae and EOM are normal. Pupils are equal, round, and reactive to light. No scleral icterus.  Neck: Normal range of motion. Neck supple. No tracheal deviation, no edema, no erythema and normal range of motion present. No thyroid mass and no thyromegaly present.  Cardiovascular: Normal rate, regular rhythm, S1 normal, S2 normal, normal heart sounds, intact distal pulses and normal pulses.  Exam reveals no gallop and no friction rub.   No murmur heard. Pulmonary/Chest: Effort normal and breath sounds normal. No respiratory distress. He has no wheezes. He has no rhonchi. He has no rales.  Abdominal: Soft. Normal appearance and bowel sounds are normal. He exhibits no distension, no  ascites and no mass. There is no hepatosplenomegaly. There is no tenderness. There is no rebound, no guarding and no CVA tenderness.  Musculoskeletal: Normal range of motion. He exhibits no edema or tenderness.  Lymphadenopathy:    He has no cervical adenopathy.  Neurological: He is alert and oriented to person, place, and time. He has normal strength. No cranial nerve deficit or sensory deficit.  Skin: Skin is warm, dry and intact. No petechiae and no rash noted. He is not diaphoretic. No erythema.  No pallor.  Psychiatric: He has a normal mood and affect. His behavior is normal. Judgment normal.  Nursing note and vitals reviewed.   ED Course  Procedures   DIAGNOSTIC STUDIES:  Oxygen Saturation is 97% on RA, normal by my interpretation.    COORDINATION OF CARE:  4:07 AM Discussed treatment plan with pt at bedside and pt agreed to plan.  Labs Review Labs Reviewed - No data to display  Imaging Review Dg Chest 2 View  04/03/2015  CLINICAL DATA:  Acute onset of shortness of breath and dry cough. Initial encounter. EXAM: CHEST  2 VIEW COMPARISON:  Chest radiograph performed 12/09/2014 FINDINGS: The lungs are well-aerated and clear. There is no evidence of focal opacification, pleural effusion or pneumothorax. The heart is normal in size; the mediastinal contour is within normal limits. No acute osseous abnormalities are seen. IMPRESSION: No acute cardiopulmonary process seen. Electronically Signed   By: Roanna Raider M.D.   On: 04/03/2015 04:28   I have personally reviewed and evaluated these images and lab results as part of my medical decision-making.   EKG Interpretation   Date/Time:  Sunday April 03 2015 03:38:11 EST Ventricular Rate:  80 PR Interval:  156 QRS Duration: 96 QT Interval:  355 QTC Calculation: 409 R Axis:   47 Text Interpretation:  Sinus rhythm No significant change since last  tracing Confirmed by Erroll Luna [phone]) on 04/03/2015 3:54:24 AM      MDM   Final diagnoses:  None   Patient presents to the ED for asthma symptoms. Upon my exam, there is no wheezing, this is after he received duoneb treatment.  Will dose with decadron prior to DC.  CXR neg for PNA.  He appears well and in NAD.  Albuterol inhaler was refilled.  Patient safe for DC with PCP fu within 3 days.   I personally performed the services described in this documentation, which was scribed in my presence. The recorded information has been reviewed and is accurate.      Tomasita Crumble, MD 04/03/15 [phone]

## 2015-04-03 NOTE — Discharge Instructions (Signed)
Asthma Attack Prevention  Melvin Barry, your chest xray does not show pneumonia.  See a prmary care physician within 3 days for close follow-up. If any sympoms worsen come back to emergency department immediately. Thank you. While you may not be able to control the fact that you have asthma, you can take actions to prevent asthma attacks. The best way to prevent asthma attacks is to maintain good control of your asthma. You can achieve this by:  Taking your medicines as directed.  Avoiding things that can irritate your airways or make your asthma symptoms worse (asthma triggers).  Keeping track of how well your asthma is controlled and of any changes in your symptoms.  Responding quickly to worsening asthma symptoms (asthma attack).  Seeking emergency care when it is needed. WHAT ARE SOME WAYS TO PREVENT AN ASTHMA ATTACK? Have a Plan Work with your health care provider to create a written plan for managing and treating your asthma attacks (asthma action plan). This plan includes:  A list of your asthma triggers and how you can avoid them.  Information on when medicines should be taken and when their dosages should be changed.  The use of a device that measures how well your lungs are working (peak flow meter). Monitor Your Asthma Use your peak flow meter and record your results in a journal every day. A drop in your peak flow numbers on one or more days may indicate the start of an asthma attack. This can happen even before you start to feel symptoms. You can prevent an asthma attack from getting worse by following the steps in your asthma action plan. Avoid Asthma Triggers Work with your asthma health care provider to find out what your asthma triggers are. This can be done by:  Allergy testing.  Keeping a journal that notes when asthma attacks occur and the factors that may have contributed to them.  Determining if there are other medical conditions that are making your asthma  worse. Once you have determined your asthma triggers, take steps to avoid them. This may include avoiding excessive or prolonged exposure to:  Dust. Have someone dust and vacuum your home for you once or twice a week. Using a high-efficiency particulate arrestance (HEPA) vacuum is best.  Smoke. This includes campfire smoke, forest fire smoke, and secondhand smoke from tobacco products.  Pet dander. Avoid contact with animals that you know you are allergic to.  Allergens from trees, grasses or pollens. Avoid spending a lot of time outdoors when pollen counts are high, and on very windy days.  Very cold, dry, or humid air.  Mold.  Foods that contain high amounts of sulfites.  Strong odors.  Outdoor air pollutants, such as Museum/gallery exhibitions officer.  Indoor air pollutants, such as aerosol sprays and fumes from household cleaners.  Household pests, including dust mites and cockroaches, and pest droppings.  Certain medicines, including NSAIDs. Always talk to your health care provider before stopping or starting any new medicines. Medicines Take over-the-counter and prescription medicines only as told by your health care provider. Many asthma attacks can be prevented by carefully following your medicine schedule. Taking your medicines correctly is especially important when you cannot avoid certain asthma triggers. Act Quickly If an asthma attack does happen, acting quickly can decrease how severe it is and how long it lasts. Take these steps:   Pay attention to your symptoms. If you are coughing, wheezing, or having difficulty breathing, do not wait to see if your symptoms go  away on their own. Follow your asthma action plan.  If you have followed your asthma action plan and your symptoms are not improving, call your health care provider or seek immediate medical care at the nearest hospital. It is important to note how often you need to use your fast-acting rescue inhaler. If you are using your  rescue inhaler more often, it may mean that your asthma is not under control. Adjusting your asthma treatment plan may help you to prevent future asthma attacks and help you to gain better control of your condition. HOW CAN I PREVENT AN ASTHMA ATTACK WHEN I EXERCISE? Follow advice from your health care provider about whether you should use your fast-acting inhaler before exercising. Many people with asthma experience exercise-induced bronchoconstriction (EIB). This condition often worsens during vigorous exercise in cold, humid, or dry environments. Usually, people with EIB can stay very active by pre-treating with a fast-acting inhaler before exercising.   This information is not intended to replace advice given to you by your health care provider. Make sure you discuss any questions you have with your health care provider.   Document Released: 02/07/2009 Document Revised: 11/10/2014 Document Reviewed: 07/22/2014 Elsevier Interactive Patient Education [employer].

## 2015-04-03 NOTE — ED Notes (Signed)
The pt has sl wheezes no resp distress.  He denies a temp or a productive cough

## 2015-04-03 NOTE — ED Notes (Signed)
The pt went to xray then returned to the room

## 2015-04-03 NOTE — ED Notes (Signed)
The pt is c/o having difficulty breathing for 2 days after he ran out of his inhaler.  Hx of asthma

## 2015-08-24 ENCOUNTER — Encounter (HOSPITAL_COMMUNITY): Payer: Self-pay | Admitting: Emergency Medicine

## 2015-08-24 ENCOUNTER — Emergency Department (HOSPITAL_COMMUNITY): Payer: Self-pay

## 2015-08-24 ENCOUNTER — Other Ambulatory Visit: Payer: Self-pay

## 2015-08-24 ENCOUNTER — Emergency Department (HOSPITAL_COMMUNITY)
Admission: EM | Admit: 2015-08-24 | Discharge: 2015-08-24 | Disposition: A | Discharge status: Home / Self Care | Payer: Self-pay | Attending: Dermatology | Admitting: Dermatology

## 2015-08-24 DIAGNOSIS — Z5321 Procedure and treatment not carried out due to patient leaving prior to being seen by health care provider: Secondary | ICD-10-CM | POA: Insufficient documentation

## 2015-08-24 DIAGNOSIS — R079 Chest pain, unspecified: Secondary | ICD-10-CM | POA: Insufficient documentation

## 2015-08-24 LAB — BASIC METABOLIC PANEL
Anion gap: 9 (ref 5–15)
BUN: 12 mg/dL (ref 6–20)
CALCIUM: 9.1 mg/dL (ref 8.9–10.3)
CHLORIDE: 106 mmol/L (ref 101–111)
CO2: 26 mmol/L (ref 22–32)
CREATININE: 1.03 mg/dL (ref 0.61–1.24)
GFR calc Af Amer: >60 mL/min (ref >60)
GFR calc non Af Amer: >60 mL/min (ref >60)
GLUCOSE: 92 mg/dL (ref 65–99)
Potassium: 4.4 mmol/L (ref 3.5–5.1)
Sodium: 141 mmol/L (ref 135–145)

## 2015-08-24 LAB — I-STAT TROPONIN, ED: Troponin i, poc: 0 ng/mL (ref 0.00–0.08)

## 2015-08-24 LAB — CBC
HCT: 45.4 % (ref 39.0–52.0)
Hemoglobin: 15.8 g/dL (ref 13.0–17.0)
MCH: 30.9 pg (ref 26.0–34.0)
MCHC: 34.8 g/dL (ref 30.0–36.0)
MCV: 88.8 fL (ref 78.0–100.0)
PLATELETS: 294 10*3/uL (ref 150–400)
RBC: 5.11 MIL/uL (ref 4.22–5.81)
RDW: 12.7 % (ref 11.5–15.5)
WBC: 13.1 10*3/uL — ABNORMAL HIGH (ref 4.0–10.5)

## 2015-08-24 NOTE — ED Notes (Signed)
Patient came up to [facility] desk stating [place] [facility] [place] [facility] [facility] Patient states "I have been awaiting for 30 minutes and this is ridiculous". Apologized for the wait but walked away from desk stating he was leaving. He refused to sign AMA form. Patient was alert, oriented x 4. Respirations were even, regular, and unlabored. Skin warm, dry, and pink. Appeared in no acute distress. Ambulated out of facility with a steady gait.

## 2015-08-24 NOTE — ED Notes (Signed)
Pt states that he has had chest pain x 2 weeks that is worse when he breathes or coughs. Denies recent illness. Alert and oriented.

## 2015-10-06 ENCOUNTER — Encounter (HOSPITAL_COMMUNITY): Payer: Self-pay | Admitting: *Deleted

## 2015-10-06 ENCOUNTER — Emergency Department (HOSPITAL_COMMUNITY)
Admission: EM | Admit: 2015-10-06 | Discharge: 2015-10-07 | Disposition: A | Discharge status: Other Acute Inpatient Hospital | Payer: Self-pay | Attending: Emergency Medicine | Admitting: Emergency Medicine

## 2015-10-06 DIAGNOSIS — F332 Major depressive disorder, recurrent severe without psychotic features: Secondary | ICD-10-CM

## 2015-10-06 DIAGNOSIS — J45909 Unspecified asthma, uncomplicated: Secondary | ICD-10-CM | POA: Insufficient documentation

## 2015-10-06 DIAGNOSIS — F17210 Nicotine dependence, cigarettes, uncomplicated: Secondary | ICD-10-CM | POA: Insufficient documentation

## 2015-10-06 DIAGNOSIS — F4320 Adjustment disorder, unspecified: Secondary | ICD-10-CM | POA: Insufficient documentation

## 2015-10-06 DIAGNOSIS — F1910 Other psychoactive substance abuse, uncomplicated: Secondary | ICD-10-CM | POA: Diagnosis present

## 2015-10-06 DIAGNOSIS — Z79899 Other long term (current) drug therapy: Secondary | ICD-10-CM | POA: Insufficient documentation

## 2015-10-06 DIAGNOSIS — R45851 Suicidal ideations: Secondary | ICD-10-CM | POA: Insufficient documentation

## 2015-10-06 HISTORY — DX: Major depressive disorder, single episode, unspecified: F32.9

## 2015-10-06 HISTORY — DX: Anxiety disorder, unspecified: F41.9

## 2015-10-06 HISTORY — DX: Depression, unspecified: F32.A

## 2015-10-06 MED ORDER — LORAZEPAM 1 MG PO TABS
2.0000 mg | ORAL_TABLET | Freq: Once | ORAL | Status: AC
  Administered 2015-10-07: 2 mg via ORAL
  Filled 2015-10-06: qty 2

## 2015-10-06 NOTE — ED Notes (Signed)
Pt has in belonging bag:  Dark blue t-shirt, black and white shoes, black jeans, cig box, green lighter, black phone, (3) one dollar bill, and change.

## 2015-10-06 NOTE — ED Triage Notes (Signed)
Pt arrives to the ER via [employer] with suicidal ideations; pt's father advised [employer] that the pt had a shotgun to his chin earlier this evening; pt states that he has lost his job and his fiance of 5 years in the last week; pt states 'I don't know how to cope or deal with this"; pt states that he has walked from his house to a bridge several times but has not had the courage to jump; pt states that he has not been taking his medication for the last 1 year and a half; pt reports that he has not been taking his Keppra for the last 2 months; pt states that he has increased his ETOH use in the last few months; pt states that he has been drinking today as well

## 2015-10-07 ENCOUNTER — Inpatient Hospital Stay (HOSPITAL_COMMUNITY)
Admission: AD | Admit: 2015-10-07 | Discharge: 2015-10-10 | DRG: 885 | Disposition: A | Discharge status: Home / Self Care | Payer: Federal, State, Local not specified - Other | Attending: Psychiatry | Admitting: Psychiatry

## 2015-10-07 ENCOUNTER — Encounter (HOSPITAL_COMMUNITY): Payer: Self-pay

## 2015-10-07 DIAGNOSIS — F332 Major depressive disorder, recurrent severe without psychotic features: Secondary | ICD-10-CM | POA: Diagnosis present

## 2015-10-07 DIAGNOSIS — Z9114 Patient's other noncompliance with medication regimen: Secondary | ICD-10-CM

## 2015-10-07 DIAGNOSIS — F329 Major depressive disorder, single episode, unspecified: Secondary | ICD-10-CM | POA: Diagnosis present

## 2015-10-07 DIAGNOSIS — Y906 Blood alcohol level of 120-199 mg/100 ml: Secondary | ICD-10-CM | POA: Diagnosis present

## 2015-10-07 DIAGNOSIS — F1020 Alcohol dependence, uncomplicated: Secondary | ICD-10-CM | POA: Diagnosis not present

## 2015-10-07 DIAGNOSIS — F17210 Nicotine dependence, cigarettes, uncomplicated: Secondary | ICD-10-CM | POA: Diagnosis present

## 2015-10-07 DIAGNOSIS — F1910 Other psychoactive substance abuse, uncomplicated: Secondary | ICD-10-CM | POA: Diagnosis present

## 2015-10-07 LAB — COMPREHENSIVE METABOLIC PANEL
ALT: 31 U/L (ref 17–63)
ANION GAP: 9 (ref 5–15)
AST: 31 U/L (ref 15–41)
Albumin: 4.8 g/dL (ref 3.5–5.0)
Alkaline Phosphatase: 63 U/L (ref 38–126)
BUN: 13 mg/dL (ref 6–20)
CHLORIDE: 105 mmol/L (ref 101–111)
CO2: 24 mmol/L (ref 22–32)
Calcium: 9.2 mg/dL (ref 8.9–10.3)
Creatinine, Ser: 0.78 mg/dL (ref 0.61–1.24)
GFR calc non Af Amer: >60 mL/min (ref >60)
Glucose, Bld: 95 mg/dL (ref 65–99)
Potassium: 3.9 mmol/L (ref 3.5–5.1)
SODIUM: 138 mmol/L (ref 135–145)
Total Bilirubin: 0.5 mg/dL (ref 0.3–1.2)
Total Protein: 8.6 g/dL — ABNORMAL HIGH (ref 6.5–8.1)

## 2015-10-07 LAB — CBC
HCT: 45.7 % (ref 39.0–52.0)
HEMOGLOBIN: 16.2 g/dL (ref 13.0–17.0)
MCH: 31.2 pg (ref 26.0–34.0)
MCHC: 35.4 g/dL (ref 30.0–36.0)
MCV: 87.9 fL (ref 78.0–100.0)
Platelets: 259 10*3/uL (ref 150–400)
RBC: 5.2 MIL/uL (ref 4.22–5.81)
RDW: 12.4 % (ref 11.5–15.5)
WBC: 7.8 10*3/uL (ref 4.0–10.5)

## 2015-10-07 LAB — RAPID URINE DRUG SCREEN, HOSP PERFORMED
AMPHETAMINES: NOT DETECTED
Barbiturates: NOT DETECTED
Benzodiazepines: NOT DETECTED
COCAINE: NOT DETECTED
OPIATES: NOT DETECTED
TETRAHYDROCANNABINOL: POSITIVE — AB

## 2015-10-07 LAB — SALICYLATE LEVEL

## 2015-10-07 LAB — ACETAMINOPHEN LEVEL

## 2015-10-07 LAB — ETHANOL: Alcohol, Ethyl (B): 170 mg/dL — ABNORMAL HIGH (ref <5)

## 2015-10-07 MED ORDER — CHLORDIAZEPOXIDE HCL 25 MG PO CAPS: 25.0000 mg | ORAL_CAPSULE | Freq: Four times a day (QID) | ORAL | Status: DC | PRN

## 2015-10-07 MED ORDER — ZIPRASIDONE HCL 20 MG PO CAPS
20.0000 mg | ORAL_CAPSULE | Freq: Three times a day (TID) | ORAL | Status: DC | PRN
  Administered 2015-10-07: 20 mg via ORAL
  Filled 2015-10-07: qty 1

## 2015-10-07 MED ORDER — ALBUTEROL SULFATE HFA 108 (90 BASE) MCG/ACT IN AERS: 2.0000 | INHALATION_SPRAY | Freq: Four times a day (QID) | RESPIRATORY_TRACT | Status: DC | PRN

## 2015-10-07 MED ORDER — ONDANSETRON 4 MG PO TBDP: 4.0000 mg | ORAL_TABLET | Freq: Four times a day (QID) | ORAL | Status: DC | PRN

## 2015-10-07 MED ORDER — CHLORDIAZEPOXIDE HCL 25 MG PO CAPS
25.0000 mg | ORAL_CAPSULE | Freq: Three times a day (TID) | ORAL | Status: AC
  Administered 2015-10-09 – 2015-10-10 (×3): 25 mg via ORAL
  Filled 2015-10-07 (×3): qty 1

## 2015-10-07 MED ORDER — ACETAMINOPHEN 325 MG PO TABS: 650.0000 mg | ORAL_TABLET | Freq: Four times a day (QID) | ORAL | Status: DC | PRN

## 2015-10-07 MED ORDER — ALUM & MAG HYDROXIDE-SIMETH 200-200-20 MG/5ML PO SUSP: 30.0000 mL | ORAL | Status: DC | PRN

## 2015-10-07 MED ORDER — HYDROXYZINE HCL 25 MG PO TABS
25.0000 mg | ORAL_TABLET | Freq: Four times a day (QID) | ORAL | Status: DC | PRN
  Administered 2015-10-07 – 2015-10-09 (×3): 25 mg via ORAL
  Filled 2015-10-07 (×3): qty 1

## 2015-10-07 MED ORDER — THIAMINE HCL 100 MG/ML IJ SOLN
100.0000 mg | Freq: Once | INTRAMUSCULAR | Status: AC
  Administered 2015-10-07: 100 mg via INTRAMUSCULAR
  Filled 2015-10-07: qty 2

## 2015-10-07 MED ORDER — LORAZEPAM 1 MG PO TABS
2.0000 mg | ORAL_TABLET | Freq: Four times a day (QID) | ORAL | Status: DC | PRN
  Administered 2015-10-07 (×2): 2 mg via ORAL
  Filled 2015-10-07 (×2): qty 2

## 2015-10-07 MED ORDER — VITAMIN B-1 100 MG PO TABS
100.0000 mg | ORAL_TABLET | Freq: Every day | ORAL | Status: DC
  Administered 2015-10-08 – 2015-10-10 (×3): 100 mg via ORAL
  Filled 2015-10-07 (×5): qty 1

## 2015-10-07 MED ORDER — MIRTAZAPINE 15 MG PO TABS
15.0000 mg | ORAL_TABLET | Freq: Every day | ORAL | Status: DC
  Administered 2015-10-07: 15 mg via ORAL
  Filled 2015-10-07 (×3): qty 1

## 2015-10-07 MED ORDER — LOPERAMIDE HCL 2 MG PO CAPS: 2.0000 mg | ORAL_CAPSULE | ORAL | Status: DC | PRN

## 2015-10-07 MED ORDER — CHLORDIAZEPOXIDE HCL 25 MG PO CAPS
25.0000 mg | ORAL_CAPSULE | Freq: Four times a day (QID) | ORAL | Status: AC
  Administered 2015-10-07 – 2015-10-09 (×5): 25 mg via ORAL
  Filled 2015-10-07 (×4): qty 1

## 2015-10-07 MED ORDER — LEVETIRACETAM 500 MG PO TABS
500.0000 mg | ORAL_TABLET | Freq: Two times a day (BID) | ORAL | Status: DC
  Administered 2015-10-07 – 2015-10-10 (×6): 500 mg via ORAL
  Filled 2015-10-07 (×7): qty 1
  Filled 2015-10-07: qty 14
  Filled 2015-10-07 (×2): qty 1
  Filled 2015-10-07: qty 14

## 2015-10-07 MED ORDER — NICOTINE 21 MG/24HR TD PT24
21.0000 mg | MEDICATED_PATCH | Freq: Every day | TRANSDERMAL | Status: DC
  Administered 2015-10-07 – 2015-10-10 (×4): 21 mg via TRANSDERMAL
  Filled 2015-10-07 (×6): qty 1

## 2015-10-07 MED ORDER — ADULT MULTIVITAMIN W/MINERALS CH
1.0000 | ORAL_TABLET | Freq: Every day | ORAL | Status: DC
  Administered 2015-10-08 – 2015-10-10 (×3): 1 via ORAL
  Filled 2015-10-07 (×5): qty 1

## 2015-10-07 MED ORDER — CHLORDIAZEPOXIDE HCL 25 MG PO CAPS: 25.0000 mg | ORAL_CAPSULE | Freq: Every day | ORAL | Status: DC

## 2015-10-07 MED ORDER — DIPHENHYDRAMINE HCL 25 MG PO CAPS
50.0000 mg | ORAL_CAPSULE | Freq: Four times a day (QID) | ORAL | Status: DC | PRN
  Administered 2015-10-07: 50 mg via ORAL
  Filled 2015-10-07: qty 2

## 2015-10-07 MED ORDER — LEVETIRACETAM 500 MG PO TABS
500.0000 mg | ORAL_TABLET | Freq: Two times a day (BID) | ORAL | Status: DC
  Administered 2015-10-07 (×2): 500 mg via ORAL
  Filled 2015-10-07 (×2): qty 1

## 2015-10-07 MED ORDER — MAGNESIUM HYDROXIDE 400 MG/5ML PO SUSP: 30.0000 mL | Freq: Every day | ORAL | Status: DC | PRN

## 2015-10-07 MED ORDER — CHLORDIAZEPOXIDE HCL 25 MG PO CAPS: 25.0000 mg | ORAL_CAPSULE | ORAL | Status: DC

## 2015-10-07 NOTE — ED Notes (Signed)
Pt reporting insomnia and increasing anxiety. Pt reports increasing depression and earlier walking several times to a bridge trying to get up the nerve to jump. Pt denies pain HI, A/ V H at this time. Pt requesting medications for anxiety and sleep, orders received and pt took PO medications. Pt encouraged to get some rest before morning. Will continue to assess.

## 2015-10-07 NOTE — BH Assessment (Addendum)
Assessment Note  Melvin Barry is an 25 y.o. male that presents this date under IVC initiated by Loistine Simas MD. Patient reported ongoing depression for the last week due to loss of a pet, employment issues and problems with his girlfriend. Patient reported his depression to be at a 9 this date and anxiety to be at a 8. Patient did admit to having thoughts of self harm on admission with a plan to harm himself. Collateral obtained from father Aristides Luckey (who was present ) stated he went to patient's apartment last night on 10/06/15 and found patient in possession of a firearm (shotgun) making statements of self harm. Patient reports having a history of depression and anxiety but has not been on medications for the last two months due to his insurance being discontinued. Patient stated they have been receiving medications from Abott MD for the last year (patient cannot remember what practice Abott is with or his medication regimen). Patient denies any prior history of self harm or inpatient admission/s. Patient denies any OP treatment for SA issues and states he is an "occansional drinker" although this writer feels patient may be minimizing huis use. Patient also reports periodic use of Cannabis. Patient denies current S/I, H/I or AVH but did admit to thoughts of self harm at the time of incident. Patient denies any previous MH issues but states he has on going depression and anxiety due to multiple stressors. Patient reports he had an acute episode last night with a panic attack where he "collapsed" in front of his dad. Patient reports that father has been staying at the patient's house because he is worried about it. Admission notes state: "Tonight the dad entered the son's room and found him with a shotgun under his chin. The father was able to convince them to put the gun down. The patient left the house and was "going for a walk". The father was concerned that he was "going to go jump off a bridge or  something" and went after him. Because was on the phone with the patient the same time and became concerned. He had not been called 911. Police intervened. Patient was brought here for evaluation. Dad went back to the house and cannot find a shotgun. Pt arrives to the ER via [employer] with suicidal ideations; pt's father advised [employer] that the pt had a shotgun to his chin earlier this evening; pt states that he has lost his job and his fiance of 5 years in the last week; pt states 'I don't know how to cope or deal with this"; pt states that he has walked from his house to a bridge several times but has not had the courage to jump; pt states that he has not been taking his medication for the last 1 year and a half; pt reports that he has not been taking his Keppra for the last 2 months; pt states that he has increased his ETOH use in the last few months; pt states that he has been drinking today as well." Case was staffed with Akintayo MD and meets the criteria for an inpatient admission per IVC as appropriate bed placement is investigated.  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 Anxiety   . Asthma   . Depression   . Irregular heart beat    pt unsure what the irregular rhythm is  . Seizures (HCC)  History reviewed. No pertinent surgical history.  Family History: No family history on file.  Social History:  reports that he has been smoking Cigarettes.  He has been smoking about 1.00 pack per day. He has never used smokeless tobacco. He reports that he drinks alcohol. He reports that he does not use drugs.  Additional Social History:  Alcohol / Drug Use Pain Medications: See MAR Prescriptions: See MAR Over the Counter: See MAR History of alcohol / drug use?: Yes Longest period of sobriety (when/how long): pt states he has drank "off and on since age 80." Negative  Consequences of Use:  (pt denies) Withdrawal Symptoms:  (Denies) Substance #1 Name of Substance 1: Alcohol 1 - Age of First Use: 18 1 - Amount (size/oz): 5 to 6 12 oz beers 1 - Frequency: two to three times a week 1 - Duration: Last two years 1 - Last Use / Amount: 10/06/15 pt reports consuming 3 12 oz beers Substance #2 Name of Substance 2: Cannabis 2 - Age of First Use: 18 2 - Amount (size/oz): 1 to 2 grams 2 - Frequency: once or twice a month 2 - Duration: last two years 2 - Last Use / Amount: 10/04/15 pt reported using 1 gram  CIWA: CIWA-Ar BP: (!) 118/52 (rechecked blood pressure done by Nolon Bussing) Pulse Rate: 66 COWS:    Allergies: No Known Allergies  Home Medications:  (Not in a hospital admission)  OB/GYN Status:  No LMP for male patient.  General Assessment Data Location of Assessment: [facility] TTS Assessment: In system Is this a Tele or Face-to-Face Assessment?: Face-to-Face Is this an Initial Assessment or a Re-assessment for this encounter?: Initial Assessment Marital status: Single Maiden name: na Is patient pregnant?: No Pregnancy Status: No Living Arrangements: Alone Can pt return to current living arrangement?: Yes Admission Status: Involuntary Is patient capable of signing voluntary admission?: Yes Referral Source: Self/Family/Friend Insurance type: None  Medical Screening Exam [facility] Walk-in ONLY) Medical Exam completed: Yes  Crisis Care Plan Living Arrangements: Alone Legal Guardian: Other: (none) Name of Psychiatrist: None Name of Therapist: None  Education Status Is patient currently in school?: No Current Grade: na Highest grade of school patient has completed: 12 Name of school: na Contact person: na  Risk to self with the past 6 months Suicidal Ideation: Yes-Currently Present (per IVC on admission) Has patient been a risk to self within the past 6 months prior to admission? : No Suicidal Intent: Yes-Currently Present (per IVC on  admissi) Has patient had any suicidal intent within the past 6 months prior to admission? : No Is patient at risk for suicide?: Yes Suicidal Plan?: Yes-Currently Present Has patient had any suicidal plan within the past 6 months prior to admission? : No Specify Current Suicidal Plan: pt has a firearm Access to Means: Yes Specify Access to Suicidal Means: pt has a firearm What has been your use of drugs/alcohol within the last 12 months?: Current use Previous Attempts/Gestures: No (per pt) How many times?: 0 Other Self Harm Risks: None Triggers for Past Attempts: Other (Comment) (none noted) Intentional Self Injurious Behavior: None Family Suicide History: No Recent stressful life event(s): Conflict (Comment) (relationship issues) Persecutory voices/beliefs?: No Depression: Yes Depression Symptoms: Loss of interest in usual pleasures, Feeling worthless/self pity Substance abuse history and/or treatment for substance abuse?: Yes Suicide prevention information given to non-admitted patients: Not applicable  Risk to Others within the past 6 months Homicidal Ideation: No Does patient have any lifetime risk of  violence toward others beyond the six months prior to admission? : No Thoughts of Harm to Others: No Current Homicidal Intent: No Current Homicidal Plan: No Access to Homicidal Means: No Identified Victim: na History of harm to others?: No Assessment of Violence: None Noted Violent Behavior Description: none Does patient have access to weapons?: Yes (Comment) Criminal Charges Pending?: No Does patient have a court date: No Is patient on probation?: No  Psychosis Hallucinations: None noted Delusions: None noted  Mental Status Report Appearance/Hygiene: Unremarkable Eye Contact: Fair Motor Activity: Freedom of movement Speech: Unremarkable Level of Consciousness: Drowsy Mood: Depressed Affect: Anxious, Depressed Anxiety Level: Moderate Thought Processes: Coherent,  Relevant Judgement: Unimpaired Orientation: Person, Place, Time Obsessive Compulsive Thoughts/Behaviors: None  Cognitive Functioning Concentration: Decreased Memory: Recent Intact IQ: Average Insight: Fair Impulse Control: Fair Appetite: Fair Weight Loss: 0 Weight Gain: 0 Sleep: Decreased Total Hours of Sleep: 5 Vegetative Symptoms: None  ADLScreening [facility] Assessment Services) Patient's cognitive ability adequate to safely complete daily activities?: Yes Patient able to express need for assistance with ADLs?: Yes Independently performs ADLs?: Yes (appropriate for developmental age)  Prior Inpatient Therapy Prior Inpatient Therapy: No Prior Therapy Dates: NA Prior Therapy Facilty/Provider(s): NA Reason for Treatment: NA  Prior Outpatient Therapy Prior Outpatient Therapy: No Prior Therapy Dates: na Prior Therapy Facilty/Provider(s): None (per pt) Reason for Treatment: NA Does patient have an ACCT team?: No Does patient have Intensive In-House Services?  : No Does patient have [facility] services? : No Does patient have P4CC services?: No  ADL Screening (condition at time of admission) Patient's cognitive ability adequate to safely complete daily activities?: Yes Is the patient deaf or have difficulty hearing?: No Does the patient have difficulty seeing, even when wearing glasses/contacts?: No Does the patient have difficulty concentrating, remembering, or making decisions?: No Patient able to express need for assistance with ADLs?: Yes Does the patient have difficulty dressing or bathing?: No Independently performs ADLs?: Yes (appropriate for developmental age) Does the patient have difficulty walking or climbing stairs?: No Weakness of Legs: None Weakness of Arms/Hands: None  Home Assistive Devices/Equipment Home Assistive Devices/Equipment: None  Therapy Consults (therapy consults require a physician order) PT Evaluation Needed: No OT Evalulation Needed: No SLP  Evaluation Needed: No Abuse/Neglect Assessment (Assessment to be complete while patient is alone) Physical Abuse: Denies Verbal Abuse: Denies Sexual Abuse: Denies Exploitation of patient/patient's resources: Denies Self-Neglect: Denies Values / Beliefs Cultural Requests During Hospitalization: None Spiritual Requests During Hospitalization: None Consults Spiritual Care Consult Needed: No Social Work Consult Needed: No Merchant navy officer (For Healthcare) Does patient have an advance directive?: No Would patient like information on creating an advanced directive?: No - patient declined information (pt declined)    Additional Information 1:1 In Past 12 Months?: No CIRT Risk: No Elopement Risk: No Does patient have medical clearance?: Yes     Disposition: Case was staffed with Akintayo MD and meets the criteria for an inpatient admission per IVC as appropriate bed placement is investigated.  Disposition Initial Assessment Completed for this Encounter: Yes Disposition of Patient: Inpatient treatment program Type of inpatient treatment program: Adult  On Site Evaluation by:   Reviewed with Physician:    Alfredia Ferguson 10/07/2015 10:30 AM

## 2015-10-07 NOTE — ED Notes (Signed)
Pt transported to [facility] by [employer]. Pt preferred to go home, but went without complaint or incident. He refused to sign his belongings sheet because he was not able to inventory the items here on the unit. Belongings were given to the police and pt told that he would be able to see them when they are inventoried on admission at [facility].

## 2015-10-07 NOTE — Tx Team (Signed)
Initial Interdisciplinary Treatment Plan   PATIENT STRESSORS: Marital or family conflict Occupational concerns Substance abuse   PATIENT STRENGTHS: Wellsite geologist fund of knowledge   PROBLEM LIST: Problem List/Patient Goals Date to be addressed Date deferred Reason deferred Estimated date of resolution  Depression 10/07/15     Suicidal ideation 10/07/15     Substance abuse 10/07/15     "Leave early" 10/07/15     "Get my medication straight so this won't happen again" 10/07/15                              DISCHARGE CRITERIA:  Improved stabilization in mood, thinking, and/or behavior Verbal commitment to aftercare and medication compliance Withdrawal symptoms are absent or subacute and managed without 24-hour nursing intervention  PRELIMINARY DISCHARGE PLAN: Outpatient therapy Medication management  PATIENT/FAMIILY INVOLVEMENT: This treatment plan has been presented to and reviewed with the patient, Melvin Barry.  The patient and family have been given the opportunity to ask questions and make suggestions.  Melvin Barry 10/07/2015, 5:14 PM

## 2015-10-07 NOTE — ED Notes (Signed)
Pt reports feeling anxious and he appears to be irritable. He does not want to go for further treatment and does not understand what it means to be under involuntary commitment. He requested medication for anxiety and was given Ativan 2 mg, po,  as ordered. Pt has been in bed resting all morning. He ate 100% of breakfast.

## 2015-10-07 NOTE — Progress Notes (Signed)
Melvin Barry is a 25 y.o. male being admitted involuntarily to [facility] from [facility]. He has a history of anxiety and depression. He has been having problems at work and noncompliance with medications.   His girlfriend broke up with him this week she has been "coming around and harassing him" according to the patient's father. He had an acute episode last night with a panic attack where he "collapsed" in front of his dad. He had been staying with his father and his father came to the room to check on him and he was found with a under his chin.  The patient left the house and father called the police.  The police intervened and brought him to the ED.  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  He hasn't been taking his medication for seizures, Keppra, and last seizure was a month ago.   He currently denies SI/HI or A/V hallucinations.  Admission paperwork completed and signed.  Belongings searched and secured in locker # 20.  Skin assessment completed and noted multiple tattoos and old self inflicted cuts to left shoulder.  Q 15 minute checks initiated for safety.  We will monitor the progress towards his goals.

## 2015-10-07 NOTE — Progress Notes (Signed)
Patient attended AA group meeting.  

## 2015-10-07 NOTE — ED Provider Notes (Signed)
[facility]-EMERGENCY DEPT Provider Note   CSN: [id number] Arrival date & time: 10/06/15  2246  First Provider Contact:       History   Chief Complaint Chief Complaint  Patient presents with  . Suicidal    HPI Melvin Barry is a 25 y.o. male. He has a history of anxiety and depression. Said difficulty with his job and is behind on his pills. He thinks he is going to lose his job. His girlfriend broke up with him this week. Apparently his girlfriend is "coming around and harassing him" according to the patient's father. He had an acute episode last night with a panic attack where he "collapsed" in front of his dad. Has been staying at the patient's house because he is worried about it. Tonight the dad entered the son's room and found him with a shotgun under his chin.  The father was able to convince them to put the gun down. The patient left the house and was "going for a walk". The father was concerned that he was "going to go jump off a bridge or something" and went after him. Because was on the phone with the patient the same time and became concerned. He had not been called 911. Police intervened. Patient was brought here for evaluation. Dad went back to the house and cannot find a shotgun.  HPI  Past Medical History:  Diagnosis Date  . Anxiety   . Asthma   . Depression   . Irregular heart beat    pt unsure what the irregular rhythm is  . Seizures (HCC)     There are no active problems to display for this patient.   History reviewed. No pertinent surgical history.     Home Medications    Prior to Admission medications   Medication Sig Start Date End Date Taking? Authorizing Provider  albuterol (PROVENTIL HFA;VENTOLIN HFA) 108 (90 BASE) MCG/ACT inhaler Inhale 2 puffs into the lungs every 6 (six) hours as needed for wheezing or shortness of breath.   Yes Historical Provider, MD  levETIRAcetam (KEPPRA) 500 MG tablet Take 1 tablet (500 mg total) by mouth 2 (two) times  daily. Patient not taking: Reported on 10/06/2015 02/21/15   Lorre Nick, MD  oxyCODONE-acetaminophen (PERCOCET/ROXICET) 5-325 MG tablet Take 1 tablet by mouth every 4 (four) hours as needed for severe pain. Patient not taking: Reported on 02/21/2015 12/09/14   Raeford Razor, MD    Family History No family history on file.  Social History Social History  Substance Use Topics  . Smoking status: Current Every Day Smoker    Packs/day: 1.00    Types: Cigarettes  . Smokeless tobacco: Never Used  . Alcohol use Yes     Comment: socially     Allergies   Review of patient's allergies indicates no known allergies.   Review of Systems Review of Systems  Constitutional: Negative for appetite change, chills, diaphoresis, fatigue and fever.  HENT: Negative for mouth sores, sore throat and trouble swallowing.   Eyes: Negative for visual disturbance.  Respiratory: Negative for cough, chest tightness, shortness of breath and wheezing.   Cardiovascular: Negative for chest pain.  Gastrointestinal: Negative for abdominal distention, abdominal pain, diarrhea, nausea and vomiting.  Endocrine: Negative for polydipsia, polyphagia and polyuria.  Genitourinary: Negative for dysuria, frequency and hematuria.  Musculoskeletal: Negative for gait problem.  Skin: Negative for color change, pallor and rash.  Neurological: Negative for dizziness, syncope, light-headedness and headaches.  Hematological: Does not bruise/bleed easily.  Psychiatric/Behavioral: Positive  for dysphoric mood and suicidal ideas. Negative for behavioral problems and confusion. The patient is nervous/anxious.      Physical Exam Updated Vital Signs BP 137/96 (BP Location: Left Arm)   Pulse 97   Temp 98.3 F (36.8 C) (Oral)   Resp 18   SpO2 98%   Physical Exam  Constitutional: He is oriented to person, place, and time. He appears well-developed and well-nourished. No distress.  HENT:  Head: Normocephalic.  Eyes:  Conjunctivae are normal. Pupils are equal, round, and reactive to light. No scleral icterus.  Neck: Normal range of motion. Neck supple. No thyromegaly present.  Cardiovascular: Normal rate and regular rhythm.  Exam reveals no gallop and no friction rub.   No murmur heard. Pulmonary/Chest: Effort normal and breath sounds normal. No respiratory distress. He has no wheezes. He has no rales.  Abdominal: Soft. Bowel sounds are normal. He exhibits no distension. There is no tenderness. There is no rebound.  Musculoskeletal: Normal range of motion.  Neurological: He is alert and oriented to person, place, and time.  Skin: Skin is warm and dry. No rash noted.  Psychiatric:  Patient is obviously anxious. Wringing his hands back and forth. States he can't control his thoughts. Pressured speech and increased psychomotor activity. Almost agitated.     ED Treatments / Results  Labs (all labs ordered are listed, but only abnormal results are displayed) Labs Reviewed  URINE RAPID DRUG SCREEN, HOSP PERFORMED - Abnormal; Notable for the following:       Result Value   Tetrahydrocannabinol POSITIVE (*)    All other components within normal limits  CBC  COMPREHENSIVE METABOLIC PANEL  ETHANOL  SALICYLATE LEVEL  ACETAMINOPHEN LEVEL    EKG  EKG Interpretation None       Radiology No results found.  Procedures Procedures (including critical care time)  Medications Ordered in ED Medications  LORazepam (ATIVAN) tablet 2 mg (not administered)     Initial Impression / Assessment and Plan / ED Course  I have reviewed the triage vital signs and the nursing notes.  Pertinent labs & imaging results that were available during my care of the patient were reviewed by me and considered in my medical decision making (see chart for details).  Clinical Course    Given some water and some by mouth Ativan by myself. Place an IVC by myself. Awaiting screening labs and TTS evaluation.  Final  Clinical Impressions(s) / ED Diagnoses   Final diagnoses:  Suicidal ideation  Emotional crisis    New Prescriptions New Prescriptions   No medications on file     Rolland Porter, MD 10/07/15 [phone]

## 2015-10-07 NOTE — BH Assessment (Signed)
[facility] Assessment Progress Note  Per Thedore Mins, MD, this pt requires psychiatric hospitalization at this time.  Lillia Abed, RN, [facility] has assigned pt to [facility] Rm [facility]; they will be ready to receive pt between 12:30 and 13:00.  Pt presents under IVC initiated by EDP Rolland Porter, MD, and IVC documents have been faxed to [facility].  Pt's nurse, Diane, has been notified, and agrees to call report to [phone].  Pt is to be transferred via law enforcement.  Doylene Canning, MA Triage Specialist [phone]

## 2015-10-07 NOTE — BH Assessment (Signed)
Attempted to assess patient and patient did not respond to name being called or being tapped on the shoulder. Patients nurse was able to arouse patient but patient could not stay awake to participate in the assessment. Patient was given 20mg  Geodon per nurse along with  2mg  Ativan and 50mg  Benadryl. Patient will be assessed once he is alert and able to participate in the assessment.   Davina Poke, LCSW Therapeutic Triage Specialist [facility] 10/07/2015 3:41 AM

## 2015-10-08 DIAGNOSIS — F332 Major depressive disorder, recurrent severe without psychotic features: Principal | ICD-10-CM

## 2015-10-08 MED ORDER — FLUOXETINE HCL 20 MG PO CAPS
20.0000 mg | ORAL_CAPSULE | Freq: Every day | ORAL | Status: DC
Start: 2015-10-08 — End: 2015-10-10
  Administered 2015-10-08 – 2015-10-10 (×3): 20 mg via ORAL
  Filled 2015-10-08 (×2): qty 1
  Filled 2015-10-08: qty 7
  Filled 2015-10-08 (×3): qty 1

## 2015-10-08 MED ORDER — GABAPENTIN 300 MG PO CAPS
300.0000 mg | ORAL_CAPSULE | Freq: Three times a day (TID) | ORAL | Status: DC
  Administered 2015-10-08 – 2015-10-10 (×9): 300 mg via ORAL
  Filled 2015-10-08 (×2): qty 1
  Filled 2015-10-08: qty 21
  Filled 2015-10-08 (×4): qty 1
  Filled 2015-10-08: qty 21
  Filled 2015-10-08 (×7): qty 1
  Filled 2015-10-08 (×2): qty 21

## 2015-10-08 MED ORDER — MIRTAZAPINE 7.5 MG PO TABS
7.5000 mg | ORAL_TABLET | Freq: Every day | ORAL | Status: DC
  Administered 2015-10-08 – 2015-10-09 (×2): 7.5 mg via ORAL
  Filled 2015-10-08: qty 7
  Filled 2015-10-08 (×3): qty 1
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
## 2015-10-08 NOTE — Progress Notes (Signed)
Adult Psychoeducational Group Note  Date:  10/08/2015 Time:  10:25 pm  Group Topic/Focus:  Wrap-Up Group:   The focus of this group is to help patients review their daily goal of treatment and discuss progress on daily workbooks.   Participation Level:  Active  Participation Quality:  Appropriate  Affect:  Appropriate  Cognitive:  Alert, Appropriate and Oriented  Insight: Appropriate  Engagement in Group:  Engaged  Modes of Intervention:  Discussion  Additional Comments: Patient attended group and stated that his day was a 2.  His goal for today was to remain claim and he did.  He is working on his Pharmacologist.  Lianni Kanaan W Ritu Gagliardo 10/09/2015, 12:00 AM

## 2015-10-08 NOTE — [facility] Group Notes (Signed)
[facility] Group Notes:  (Clinical Social Work)   01/01/2015     10:00-11:00AM  Summary of Progress/Problems:   In today's process group a decisional balance exercise was used to explore in depth the perceived benefits and costs of alcohol and drugs, as well as the  benefits and costs of replacing these with healthy coping skills.  Patients listed healthy and unhealthy coping techniques, particularly those that they utilize currently.  Motivational Interviewing and the whiteboard were utilized for the exercises.  The patient expressed that the (1) healthy and (2) unhealthy coping he often uses are (1) playing with his dog (who was just killed) and (2) alcohol (recently increased).  He participated well in group, was interactive with bright affect.  Type of Therapy:  Group Therapy - Process   Participation Level:  Active  Participation Quality:  Attentive and Sharing  Affect:  Appropriate  Cognitive:  Appropriate  Insight:  Developing/Improving  Engagement in Therapy:  Engaged  Modes of Intervention:  Education, Motivational Interviewing  [name], LCSW 10/08/2015, 3:53 PM
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
## 2015-10-08 NOTE — Progress Notes (Addendum)
D: Pt is very irritable; endorsed severe anxiety and severe depression and could be Ativan seeking; states, "I am very anxious right now, can I get that medication that starts with an "A" I got that at the ED. Pt denied pain, SI, HI or AVH. Pt was frequent at the nurses station. A: Medications offered as prescribed.  Support, encouragement, and safe environment provided.  15-minute safety checks continue. R: Pt was med compliant.  Pt attended AA group. Safety checks continue.

## 2015-10-08 NOTE — Progress Notes (Signed)
D: Pt denies SI, HI, AVH and pain when assessed. Endorsed anxiety at the time. Visible in milieu, engaged in unit activities and scheduled groups.  A: All medications administered as prescribed. Support and availability provided to pt. Writer encouraged pt to voice concerns / needs. Safety maintained on Q 15 minutes checks without issues.  R: Pt remains compliant with medications when offered. Denies adverse drug reactions. Attended groups on unit. Denies concerns at this time. POC effective.

## 2015-10-08 NOTE — [facility] Suicide Risk Assessment (Signed)
[facility] INPATIENT:  Family/Significant Other Suicide Prevention Education  Suicide Prevention Education:  Patient Refusal for Family/Significant Other Suicide Prevention Education: The patient Melvin Barry has refused to provide written consent for family/significant other to be provided Family/Significant Other Suicide Prevention Education during admission and/or prior to discharge.  Physician notified.  Pt may change his mind later, wanted to think about it.  Sarina Ser 10/08/2015, 3:45 PM

## 2015-10-08 NOTE — H&P (Signed)
Psychiatric Admission Assessment Adult  Patient Identification: Melvin Barry  MRN:  [id number]  Date of Evaluation:  10/08/2015  Chief Complaint:  MDD RECURRENT SEVERE ALCOHOL ABUSE DISORDER GAD  Principal Diagnosis: Alcohol use disorder, dependence.                                         Major depressive disorder Diagnosis:   Patient Active Problem List   Diagnosis Date Noted  . Major depressive disorder, recurrent severe without psychotic features ([place]) [F33.2] 10/07/2015  . Polysubstance abuse [F19.10] 10/07/2015   History of Present Illness: This is an admission assessment for this 25 year old Caucasian male. Admitted to the [facility] adult unit from the [facility] ED with complaints of worsening symptoms of depression triggering suicidal threats with a shot gun. He cited job & relationship problem as the trigger. During this assessment, Melvin Barry reports, The cops took me the hospital ED on Friday night. I guess it was because I had an emotional breakdown. I have a lot of depression & stress due to relationship break-up a month ago. After my girl-friend left, I became lonely, depressed & start drinking a lot of alcohol. I was drinking a 12 pack cans of beer daily x 1 month. I don't use drugs. I did have thoughts of hurting myself, but never acted on it. I have never attempted suicide. I have had treatment for depression & anxiety in the past, stopped taking my medicines 3 months ago after I ran out of medicines. I got no insurance. I have a lot in my plates right now".  Associated Signs/Symptoms:  Depression Symptoms:  depressed mood, insomnia, feelings of worthlessness/guilt, hopelessness, anxiety, (Hypo) Manic Symptoms:  Impulsivity,  Anxiety Symptoms:  Excessive Worry,  Psychotic Symptoms:  Denies any hallucinations, delusions or paranoia.  PTSD Symptoms: Denies any PTSD symptoms.  Total Time spent with patient: 1 hour  Past Psychiatric History: Major depressive  disorder  Is the patient at risk to self? No.  Has the patient been a risk to self in the past 6 months? No.  Has the patient been a risk to self within the distant past? No.  Is the patient a risk to others? No.  Has the patient been a risk to others in the past 6 months? No.  Has the patient been a risk to others within the distant past? No.   Prior Inpatient Therapy: [facility] in [place]  Prior Outpatient Therapy: Yes  Alcohol Screening: 1. How often do you have a drink containing alcohol?: 2 to 3 times a week 2. How many drinks containing alcohol do you have on a typical day when you are drinking?: 5 or 6 3. How often do you have six or more drinks on one occasion?: Weekly Preliminary Score: 5 4. How often during the last year have you found that you were not able to stop drinking once you had started?: Never 5. How often during the last year have you failed to do what was normally expected from you becasue of drinking?: Less than monthly 6. How often during the last year have you needed a first drink in the morning to get yourself going after a heavy drinking session?: Never 7. How often during the last year have you had a feeling of guilt of remorse after drinking?: Never 8. How often during the last year have you been unable to  remember what happened the night before because you had been drinking?: Less than monthly 9. Have you or someone else been injured as a result of your drinking?: No 10. Has a relative or friend or a doctor or another health worker been concerned about your drinking or suggested you cut down?: Yes, during the last year Alcohol Use Disorder Identification Test Final Score (AUDIT): 14 Brief Intervention: Patient declined brief intervention  Substance Abuse History in the last 12 months:  Yes.    Consequences of Substance Abuse: Medical Consequences:  Liver damage, Possible death by overdose Legal Consequences:  Arrests, jail time, Loss of  driving privilege. Family Consequences:  Family discord, divorce and or separation.  Previous Psychotropic Medications: Yes (Xanax).  Psychological Evaluations: Yes   Past Medical History:  Past Medical History:  Diagnosis Date  . Anxiety   . Asthma   . Depression   . Irregular heart beat    pt unsure what the irregular rhythm is  . Seizures ([place])    History reviewed. No pertinent surgical history.  Family History: History reviewed. No pertinent family history.  Family Psychiatric  History: Major depression: Father & brother.  Tobacco Screening: Have you used any form of tobacco in the last 30 days? (Cigarettes, Smokeless Tobacco, Cigars, and/or Pipes): Yes Tobacco use, Select all that apply: 5 or more cigarettes per day Are you interested in Tobacco Cessation Medications?: Yes, will notify MD for an order Counseled patient on smoking cessation including recognizing danger situations, developing coping skills and basic information about quitting provided: Refused/Declined practical counseling  Social History:  History  Alcohol Use  . Yes    Comment: socially     History  Drug Use No    Additional Social History: Pain Medications: See MAR Prescriptions: See MAR Over the Counter: See MAR History of alcohol / drug use?: Yes Longest period of sobriety (when/how long): pt states he has drank "off and on since age 25." Negative Consequences of Use: Personal relationships, Work / Youth worker Withdrawal Symptoms: Other (Comment) (shakiness) Name of Substance 1: Alcohol 1 - Age of First Use: 18 1 - Amount (size/oz): 5 to 6 12 oz beers 1 - Frequency: two to three times a week 1 - Duration: Last two years 1 - Last Use / Amount: 10/06/15 pt reports consuming 3 12 oz beers Name of Substance 2: Cannabis 2 - Age of First Use: 18 2 - Amount (size/oz): 1 to 2 grams 2 - Frequency: once or twice a month 2 - Duration: last two years 2 - Last Use / Amount: 10/04/15 pt reported using 1  gram  Allergies:  No Known Allergies  Lab Results:  Results for orders placed or performed during the hospital encounter of 10/06/15 (from the past 48 hour(s))  Rapid urine drug screen (hospital performed)     Status: Abnormal   Collection Time: 10/06/15 11:13 PM  Result Value Ref Range   Opiates NONE DETECTED NONE DETECTED   Cocaine NONE DETECTED NONE DETECTED   Benzodiazepines NONE DETECTED NONE DETECTED   Amphetamines NONE DETECTED NONE DETECTED   Tetrahydrocannabinol POSITIVE (A) NONE DETECTED   Barbiturates NONE DETECTED NONE DETECTED    Comment:        DRUG SCREEN FOR MEDICAL PURPOSES ONLY.  IF CONFIRMATION IS NEEDED FOR ANY PURPOSE, NOTIFY LAB WITHIN 5 DAYS.        LOWEST DETECTABLE LIMITS FOR URINE DRUG SCREEN Drug Class       Cutoff (ng/mL) Amphetamine  1000 Barbiturate      200 Benzodiazepine   559 Tricyclics       741 Opiates          300 Cocaine          300 THC              50   Comprehensive metabolic panel     Status: Abnormal   Collection Time: 10/06/15 11:53 PM  Result Value Ref Range   Sodium 138 135 - 145 mmol/L   Potassium 3.9 3.5 - 5.1 mmol/L   Chloride 105 101 - 111 mmol/L   CO2 24 22 - 32 mmol/L   Glucose, Bld 95 65 - 99 mg/dL   BUN 13 6 - 20 mg/dL   Creatinine, Ser 0.78 0.61 - 1.24 mg/dL   Calcium 9.2 8.9 - 10.3 mg/dL   Total Protein 8.6 (H) 6.5 - 8.1 g/dL   Albumin 4.8 3.5 - 5.0 g/dL   AST 31 15 - 41 U/L   ALT 31 17 - 63 U/L   Alkaline Phosphatase 63 38 - 126 U/L   Total Bilirubin 0.5 0.3 - 1.2 mg/dL   GFR calc non Af Amer >60 >60 mL/min   GFR calc Af Amer >60 >60 mL/min    Comment: (NOTE) The eGFR has been calculated using the CKD EPI equation. This calculation has not been validated in all clinical situations. eGFR's persistently <60 mL/min signify possible Chronic Kidney Disease.    Anion gap 9 5 - 15  Ethanol     Status: Abnormal   Collection Time: 10/06/15 11:53 PM  Result Value Ref Range   Alcohol, Ethyl (B) 170 (H) <5  mg/dL    Comment:        LOWEST DETECTABLE LIMIT FOR SERUM ALCOHOL IS 5 mg/dL FOR MEDICAL PURPOSES ONLY   Salicylate level     Status: None   Collection Time: 10/06/15 11:53 PM  Result Value Ref Range   Salicylate Lvl <6.3 2.8 - 30.0 mg/dL  Acetaminophen level     Status: Abnormal   Collection Time: 10/06/15 11:53 PM  Result Value Ref Range   Acetaminophen (Tylenol), Serum <10 (L) 10 - 30 ug/mL    Comment:        THERAPEUTIC CONCENTRATIONS VARY SIGNIFICANTLY. A RANGE OF 10-30 ug/mL MAY BE AN EFFECTIVE CONCENTRATION FOR MANY PATIENTS. HOWEVER, SOME ARE BEST TREATED AT CONCENTRATIONS OUTSIDE THIS RANGE. ACETAMINOPHEN CONCENTRATIONS >150 ug/mL AT 4 HOURS AFTER INGESTION AND >50 ug/mL AT 12 HOURS AFTER INGESTION ARE OFTEN ASSOCIATED WITH TOXIC REACTIONS.   cbc     Status: None   Collection Time: 10/06/15 11:53 PM  Result Value Ref Range   WBC 7.8 4.0 - 10.5 K/uL   RBC 5.20 4.22 - 5.81 MIL/uL   Hemoglobin 16.2 13.0 - 17.0 g/dL   HCT 45.7 39.0 - 52.0 %   MCV 87.9 78.0 - 100.0 fL   MCH 31.2 26.0 - 34.0 pg   MCHC 35.4 30.0 - 36.0 g/dL   RDW 12.4 11.5 - 15.5 %   Platelets 259 150 - 400 K/uL   Blood Alcohol level:  Lab Results  Component Value Date   ETH 170 (H) 10/06/2015   ETH 211 (H) 84/53/6468   Metabolic Disorder Labs:  No results found for: HGBA1C, MPG No results found for: PROLACTIN No results found for: CHOL, TRIG, HDL, CHOLHDL, VLDL, LDLCALC  Current Medications: Current Facility-Administered Medications  Medication Dose Route Frequency Provider Last Rate Last Dose  .  acetaminophen (TYLENOL) tablet 650 mg  650 mg Oral Q6H PRN Patrecia Pour, NP      . albuterol (PROVENTIL HFA;VENTOLIN HFA) 108 (90 Base) MCG/ACT inhaler 2 puff  2 puff Inhalation Q6H PRN Patrecia Pour, NP      . alum & mag hydroxide-simeth (MAALOX/MYLANTA) 200-200-20 MG/5ML suspension 30 mL  30 mL Oral Q4H PRN Patrecia Pour, NP      . chlordiazePOXIDE (LIBRIUM) capsule 25 mg  25 mg Oral Q6H  PRN Laverle Hobby, PA-C      . chlordiazePOXIDE (LIBRIUM) capsule 25 mg  25 mg Oral QID Laverle Hobby, PA-C   25 mg at 10/08/15 3875   Followed by  . [START ON 10/09/2015] chlordiazePOXIDE (LIBRIUM) capsule 25 mg  25 mg Oral TID Laverle Hobby, PA-C       Followed by  . [START ON 10/10/2015] chlordiazePOXIDE (LIBRIUM) capsule 25 mg  25 mg Oral BH-qamhs Spencer E Simon, PA-C       Followed by  . [START ON 10/12/2015] chlordiazePOXIDE (LIBRIUM) capsule 25 mg  25 mg Oral Daily Laverle Hobby, PA-C      . hydrOXYzine (ATARAX/VISTARIL) tablet 25 mg  25 mg Oral Q6H PRN Laverle Hobby, PA-C   25 mg at 10/08/15 6433  . levETIRAcetam (KEPPRA) tablet 500 mg  500 mg Oral BID Patrecia Pour, NP   500 mg at 10/08/15 0810  . loperamide (IMODIUM) capsule 2-4 mg  2-4 mg Oral PRN Laverle Hobby, PA-C      . magnesium hydroxide (MILK OF MAGNESIA) suspension 30 mL  30 mL Oral Daily PRN Patrecia Pour, NP      . mirtazapine (REMERON) tablet 15 mg  15 mg Oral QHS Laverle Hobby, PA-C   15 mg at 10/07/15 2233  . multivitamin with minerals tablet 1 tablet  1 tablet Oral Daily Laverle Hobby, PA-C   1 tablet at 10/08/15 0810  . nicotine (NICODERM CQ - dosed in mg/24 hours) patch 21 mg  21 mg Transdermal Daily Jenne Campus, MD   21 mg at 10/08/15 2951  . ondansetron (ZOFRAN-ODT) disintegrating tablet 4 mg  4 mg Oral Q6H PRN Laverle Hobby, PA-C      . thiamine (VITAMIN B-1) tablet 100 mg  100 mg Oral Daily Laverle Hobby, PA-C   100 mg at 10/08/15 8841   PTA Medications: Prescriptions Prior to Admission  Medication Sig Dispense Refill Last Dose  . albuterol (PROVENTIL HFA;VENTOLIN HFA) 108 (90 BASE) MCG/ACT inhaler Inhale 2 puffs into the lungs every 6 (six) hours as needed for wheezing or shortness of breath.   unknown at unknown  . levETIRAcetam (KEPPRA) 500 MG tablet Take 1 tablet (500 mg total) by mouth 2 (two) times daily. (Patient not taking: Reported on 10/06/2015) 60 tablet 2 Not Taking at Unknown time    Musculoskeletal: Strength & Muscle Tone: within normal limits Gait & Station: normal Patient leans: N/A  Psychiatric Specialty Exam: Physical Exam  Constitutional: He is oriented to person, place, and time. He appears well-developed.  HENT:  Head: Normocephalic.  Eyes: Pupils are equal, round, and reactive to light.  Neck: Normal range of motion.  Cardiovascular: Normal rate.   Respiratory: Effort normal.  GI: Soft.  Genitourinary:  Genitourinary Comments: Denies any issues in this area  Musculoskeletal: Normal range of motion.  Neurological: He is alert and oriented to person, place, and time.  Skin: Skin is warm and dry.  Review of Systems  Constitutional: Positive for chills, diaphoresis and malaise/fatigue.  HENT: Negative.   Eyes: Positive for blurred vision.  Respiratory: Negative.   Cardiovascular: Negative.   Gastrointestinal: Negative.   Genitourinary: Negative.   Musculoskeletal: Positive for myalgias.  Skin: Negative.   Neurological: Positive for dizziness. Negative for weakness.  Endo/Heme/Allergies: Negative.   Psychiatric/Behavioral: Positive for depression and substance abuse (Alcoholism, chronic). Negative for hallucinations, memory loss and suicidal ideas. The patient is nervous/anxious and has insomnia.     Blood pressure 131/84, pulse 76, temperature 97.4 F (36.3 C), temperature source Oral, resp. rate 16, height 5' 10"  (1.778 m), weight 94.8 kg (209 lb), SpO2 97 %.Body mass index is 29.99 kg/m.  General Appearance: Casual  Eye Contact:  Fair  Speech:  Clear and Coherent  Volume:  Normal  Mood:  Anxious and Depressed  Affect:  Flat and Tearful  Thought Process:  Coherent and Goal Directed  Orientation:  Full (Time, Place, and Person)  Thought Content:  Rumination and Denies nay hallucinations, delusional thoughts or parania.  Suicidal Thoughts:  Denies any thought, plans or intent  Homicidal Thoughts:  Denies any thoughts, plans or intent   Memory:  Immediate;   Good Recent;   Good Remote;   Good  Judgement:  Fair  Insight:  Fair  Psychomotor Activity:  Normal  Concentration:  Concentration: Fair and Attention Span: Fair  Recall:  Good  Fund of Knowledge:  Fair  Language:  Good  Akathisia:  Negative  Handed:  Right  AIMS (if indicated):     Assets:  Communication Skills Desire for Improvement  ADL's:  Intact  Cognition:  WNL  Sleep:  Number of Hours: 6.25   Treatment Plan Summary: Daily contact with patient to assess and evaluate symptoms and progress in treatment and Medication management: 1. Admit for crisis management and stabilization, estimated length of stay 3-5 days.  2. Medication management to reduce current symptoms to base line and improve the patient's overall level of functioning: See MRA for the list of medications in use currently 3. Treat health problems as indicated.  4. Develop treatment plan to decrease risk of relapse upon discharge and the need for readmission.  5. Psycho-social education regarding relapse prevention and self care.  6. Health care follow up as needed for medical problems.  7. Review, reconcile, and reinstate any pertinent home medications for other health issues where appropriate. 8. Call for consults with hospitalist for any additional specialty patient care services as needed.  Observation Level/Precautions:  15 minute checks  Laboratory:  Per ED, BAL 170, UDS + for cocaine  Psychotherapy: Group sessions, AA/NA meetings   Medications: See MRA for the list of medications in use currently  Consultations: As needed  Discharge Concerns: safety, sobriety  Estimated LOS: 2-4 days  Other: Admit to [id number]   I certify that inpatient services furnished can reasonably be expected to improve the patient's condition.    Lindell Spar I, NP, PMHNP_BC 8/5/20179:55 AM  I have examined the patient and agreed with the findings of H&P and treatment plan.  I have also done suicide  assessment on this patient

## 2015-10-09 DIAGNOSIS — F1020 Alcohol dependence, uncomplicated: Secondary | ICD-10-CM

## 2015-10-09 NOTE — Progress Notes (Signed)
[facility]  10/09/2015 2:26 PM Ginger Organimothy Tristan  MRN:  [id number]  Subjective: Marcial Pacasimothy reports, "I'm feeling a lot better today. I was able to sleep well last night. I'm able to put things into perspective now that my mind is clearing from the alcohol intoxication. My fiancee called, she is willing to work with again. [facility] & counseling [facility] after discharge".  Objective: Patient is seen & chart reviewed. He continues to experience some withdrawal symptoms. He remains on Librium detox protocols. He is pleasant during assessments and appears to be engaged in treatment. Denies any suicidal thoughts. Patient is motivated to get better and has future goals. He would like to go to [facility] after discharge to medication management & counseling services.  Principal Problem: Alcohol use disorder, dependence.  Diagnosis:   Patient Active Problem List   Diagnosis Date Noted  . Alcohol use disorder, severe, dependence ([facility]) [F10.20] 10/09/2015  . Major depressive disorder, recurrent severe without psychotic features ([facility]) [F33.2] 10/07/2015  . Polysubstance abuse [F19.10] 10/07/2015   Total Time spent with patient: 25 minuters  Past Psychiatric History: Anxiety, alcoholism.  Past Medical History:  Past Medical History:  Diagnosis Date  . Anxiety   . Asthma   . Depression   . Irregular heart beat    pt unsure what the irregular rhythm is  . Seizures ([facility])    History reviewed. No pertinent surgical history. Family History: History reviewed. No pertinent family history.  Family Psychiatric  History: See H&P  Social History:  History  Alcohol Use  . Yes    Comment: socially     History  Drug Use No    Social History   Social History  . Marital status: Single    Spouse name: N/A  . Number of children: N/A  . Years of education: N/A   Social History Main Topics  . Smoking status: Current Every Day Smoker    Packs/day:  1.00    Types: Cigarettes  . Smokeless tobacco: Never Used  . Alcohol use Yes     Comment: socially  . Drug use: No  . Sexual activity: Not Asked   Other Topics Concern  . None   Social History Narrative  . None   Additional Social History:    Pain Medications: See MAR Prescriptions: See MAR Over the Counter: See MAR History of alcohol / drug use?: Yes Longest period of sobriety (when/how long): pt states he has drank "off and on since age 25." Negative Consequences of Use: Personal relationships, Work / Programmer, multimediachool Withdrawal Symptoms: Other (Comment) (shakiness) Name of Substance 1: Alcohol 1 - Age of First Use: 18 1 - Amount (size/oz): 5 to 6 12 oz beers 1 - Frequency: two to three times a week 1 - Duration: Last two years 1 - Last Use / Amount: 10/06/15 pt reports consuming 3 12 oz beers Name of Substance 2: Cannabis 2 - Age of First Use: 18 2 - Amount (size/oz): 1 to 2 grams 2 - Frequency: once or twice a month 2 - Duration: last two years 2 - Last Use / Amount: 10/04/15 pt reported using 1 gram  Sleep: Good  Appetite:  Good  Current Medications: Current Facility-Administered Medications  Medication Dose Route Frequency Provider Last Rate Last Dose  . acetaminophen (TYLENOL) tablet 650 mg  650 mg Oral Q6H PRN Charm RingsJamison Y Lord, NP      . albuterol (PROVENTIL HFA;VENTOLIN HFA) 108 (90 Base) MCG/ACT  inhaler 2 puff  2 puff Inhalation Q6H PRN Charm Rings, NP      . alum & mag hydroxide-simeth (MAALOX/MYLANTA) 200-200-20 MG/5ML suspension 30 mL  30 mL Oral Q4H PRN Charm Rings, NP      . chlordiazePOXIDE (LIBRIUM) capsule 25 mg  25 mg Oral Q6H PRN Kerry Hough, PA-C      . chlordiazePOXIDE (LIBRIUM) capsule 25 mg  25 mg Oral TID Kerry Hough, PA-C   25 mg at 10/09/15 1203   Followed by  . [START ON 10/10/2015] chlordiazePOXIDE (LIBRIUM) capsule 25 mg  25 mg Oral BH-qamhs Spencer E Simon, PA-C       Followed by  . [START ON 10/12/2015] chlordiazePOXIDE (LIBRIUM) capsule  25 mg  25 mg Oral Daily Kerry Hough, PA-C      . FLUoxetine (PROZAC) capsule 20 mg  20 mg Oral Daily Sanjuana Kava, NP   20 mg at 10/09/15 0754  . gabapentin (NEURONTIN) capsule 300 mg  300 mg Oral TID PC & HS Sanjuana Kava, NP   300 mg at 10/09/15 1203  . hydrOXYzine (ATARAX/VISTARIL) tablet 25 mg  25 mg Oral Q6H PRN Kerry Hough, PA-C   25 mg at 10/08/15 2440  . levETIRAcetam (KEPPRA) tablet 500 mg  500 mg Oral BID Charm Rings, NP   500 mg at 10/09/15 0755  . loperamide (IMODIUM) capsule 2-4 mg  2-4 mg Oral PRN Kerry Hough, PA-C      . magnesium hydroxide (MILK OF MAGNESIA) suspension 30 mL  30 mL Oral Daily PRN Charm Rings, NP      . mirtazapine (REMERON) tablet 7.5 mg  7.5 mg Oral QHS Sanjuana Kava, NP   7.5 mg at 10/08/15 2137  . multivitamin with minerals tablet 1 tablet  1 tablet Oral Daily Kerry Hough, PA-C   1 tablet at 10/09/15 0755  . nicotine (NICODERM CQ - dosed in mg/24 hours) patch 21 mg  21 mg Transdermal Daily Craige Cotta, MD   21 mg at 10/09/15 0759  . ondansetron (ZOFRAN-ODT) disintegrating tablet 4 mg  4 mg Oral Q6H PRN Kerry Hough, PA-C      . thiamine (VITAMIN B-1) tablet 100 mg  100 mg Oral Daily Kerry Hough, PA-C   100 mg at 10/09/15 1027    Lab Results: No results found for this or any previous visit (from the past 48 hour(s)).  Blood Alcohol level:  Lab Results  Component Value Date   ETH 170 (H) 10/06/2015   ETH 211 (H) 05/20/2014    Metabolic Disorder Labs: No results found for: HGBA1C, MPG No results found for: PROLACTIN No results found for: CHOL, TRIG, HDL, CHOLHDL, VLDL, LDLCALC  Physical Findings: AIMS: Facial and Oral Movements Muscles of Facial Expression: None, normal Lips and Perioral Area: None, normal Jaw: None, normal Tongue: None, normal,Extremity Movements Upper (arms, wrists, hands, fingers): None, normal Lower (legs, knees, ankles, toes): None, normal, Trunk Movements Neck, shoulders, hips: None, normal,  Overall Severity Severity of abnormal movements (highest score from questions above): None, normal Incapacitation due to abnormal movements: None, normal Patient's awareness of abnormal movements (rate only patient's report): No Awareness, Dental Status Current problems with teeth and/or dentures?: No Does patient usually wear dentures?: No  CIWA:  CIWA-Ar Total: 4 COWS:     Musculoskeletal: Strength & Muscle Tone: within normal limits Gait & Station: normal Patient leans: N/A  Psychiatric Specialty Exam: Physical Exam  ROS  Blood pressure 136/65, pulse 82, temperature 97.4 F (36.3 C), temperature source Oral, resp. rate 16, height  (1.778 m), weight 94.8 kg (209 lb), SpO2 97 %.Body mass index is 29.99 kg/m.  General Appearance: Casual  Eye Contact:  Fair  Speech:  Clear and Coherent  Volume:  Normal  Mood:  Anxious and Depressed  Affect:  Flat and Tearful  Thought Process:  Coherent and Goal Directed  Orientation:  Full (Time, Place, and Person)  Thought Content:  Rumination and Denies nay hallucinations, delusional thoughts or parania.  Suicidal Thoughts:  Denies any thought, plans or intent  Homicidal Thoughts:  Denies any thoughts, plans or intent  Memory:  Immediate;   Good Recent;   Good Remote;   Good  Judgement:  Fair  Insight:  Fair  Psychomotor Activity:  Normal  Concentration:  Concentration: Fair and Attention Span: Fair  Recall:  Good  Fund of Knowledge:  Fair  Language:  Good  Akathisia:  Negative  Handed:  Right  AIMS (if indicated):     Assets:  Communication Skills Desire for Improvement  ADL's:  Intact  Cognition:  WNL  Sleep:  Number of Hours: 6.25     Treatment Plan Summary: Daily contact with patient to assess and evaluate symptoms and progress in treatment and Medication management; Alcohol withdrawal: Will continue the Librium detox protocols. Depression: Will continue the Prozac 20 mg. Agitation/substance withdrawal syndrome: Will  continue the Gabapentin 300 mg. Seizure disorder: Will continue the Keppra 500 mg. Insomnia: Will continue the Mirtazapine 7.5 mg. - Continue 15 minutes observation for safety concerns - Encouraged to participate in milieu therapy and group therapy counseling sessions and also work with coping skills -  Develop treatment plan to decrease risk of relapse upon discharge and to reduce the need for readmission. -  Psycho-social education regarding relapse prevention and self care. - Health care follow up as needed for medical problems. - Restart home medications where appropriate.  Armandina Stammer I, NP, PMHNP-BC 10/09/2015, 2:26 PM  I agree with findings and treatment plan of this patient

## 2015-10-09 NOTE — Progress Notes (Signed)
D: Pt was isolative and withdrawn to self. Pt endorsed moderate anxiety and depression; states, "I am better than what I was yesterday- I apologize for the way I acted." Pt denied pain, SI, HI or AVH.  A: Medications offered as prescribed.  Support, encouragement, and safe environment provided.  15-minute safety checks continue. R: Pt was med compliant.  Pt attended wrap-up group. Safety checks continue.

## 2015-10-09 NOTE — Progress Notes (Signed)
D: Pt visible in dayroom with peers, observed interacting well. Denies SI, HI and AVH. Reported sleeping good last night with good appetite, normal energy and good concentration level. Endorsed anxiety related to not being able to reach his landlord, "I need to get hold of my landlord asap today since I'm not leaving here today, I need my house when I do leave here". Rated his depression 3/10, hopelessness 2/10 and hopelessness 4/10.  A: Scheduled medications administered as prescribed. Writer assisted pt to contact his landlord, pulled landlord's company off the internet and give pt the contact information. Support and encouragement provided. Safety maintained on Q 15 maintained on Q 15 minutes checks without incident.  R: Pt receptive to care. Attended unit groups. Compliant with medications when offered. Denies adverse drug reactions. Remains safe on and off unit.

## 2015-10-09 NOTE — Progress Notes (Signed)
Patient attended AA group meeting.  

## 2015-10-09 NOTE — [facility] Group Notes (Signed)
[facility] Group Notes:  (Clinical Social Work)  10/09/2015  10:00-11:00AM  Summary of Progress/Problems:   The main focus of today's process group was to   1)  Discuss the importance of adding supports  2)  Talk about the need for healthy supports to be able to pursue various healthy coping techniques, I.e. Need for a doctor if one is going to take medications for depression  3)  Identify the patient's current healthy supports and plan what to add.  An emphasis was placed on using counselor, doctor, therapy groups, 12-step groups, and problem-specific support groups to expand supports.    The patient expressed full comprehension of the concepts presented, and agreed that there is a need to add more supports.  The patient stated his father and brother are healthy supports.  He was not engaged in group today, eventually left and did not return.  Type of Therapy:  Process Group with Motivational Interviewing  Participation Level:  Minimal  Participation Quality:  Inattentive  Affect:  Blunted  Cognitive:  Appropriate  Insight:  Limited  Engagement in Therapy:  Limited  Modes of Intervention:   Education, Support and Processing, Activity  Ambrose MantleMareida Grossman-Orr, LCSW 10/09/2015

## 2015-10-10 MED ORDER — MIRTAZAPINE 7.5 MG PO TABS: 7.5000 mg | ORAL_TABLET | Freq: Every day | ORAL | 0 refills | Status: AC

## 2015-10-10 MED ORDER — FLUOXETINE HCL 20 MG PO CAPS: 20.0000 mg | ORAL_CAPSULE | Freq: Every day | ORAL | 0 refills | Status: AC

## 2015-10-10 MED ORDER — NICOTINE 21 MG/24HR TD PT24: 21.0000 mg | MEDICATED_PATCH | Freq: Every day | TRANSDERMAL | 0 refills | Status: AC

## 2015-10-10 MED ORDER — GABAPENTIN 300 MG PO CAPS: 300.0000 mg | ORAL_CAPSULE | Freq: Three times a day (TID) | ORAL | 0 refills | Status: AC

## 2015-10-10 NOTE — Progress Notes (Signed)
D:  Patient's self inventory sheet, patient has fair sleep, sleep medication is helpful.  Good appetite, normal energy level, good concentration.  Rated depression 1, hopeless and anxiety #2.  Denied withdrawals.  Denied SI.  Denied physical problems.  Denied pain.  Goal is have a good day.  Plans to stay calm, relax.  Does have discharge plans. A:  Medications administered per MD orders.  Emotional support and encouragement given patient. R:  Denied SI and HI, contracts for safety.  Safety maintained with 15 minute checks.

## 2015-10-10 NOTE — Tx Team (Signed)
Interdisciplinary Treatment Plan Update (Adult)  Date:  10/10/2015  Time Reviewed:  10:26 AM   Progress in Treatment: Attending groups: Yes. Participating in groups:  Yes. Taking medication as prescribed:  Yes. Tolerating medication:  Yes. Family/Significant othe contact made:  SPE completed with pt; pt declined to consent to family contact.  Patient understands diagnosis:  Yes. and As evidenced by:  seeking treatment for SI, depression, alcohol abuse, grief, and for medication stabilization.  Discussing patient identified problems/goals with staff:  Yes. Medical problems stabilized or resolved:  Yes. Denies suicidal/homicidal ideation: Yes. Issues/concerns per patient self-inventory:  Other:  Discharge Plan or Barriers: Pt plans to return home via bus to live with his dad. Follow[facility]up in [facility] at [facility]. Pt has been given [place] and AA/NA pamphlets for additional community support and resources. Per pt request, bus pass in chart. PT DENIES POSSESSION OF FIREARMS.   Reason for Continuation of Hospitalization: none  Comments:  Melvin Barry is an 25 y.o. male that presents this date under IVC initiated by Leslie Dales MD. Patient reported ongoing depression for the last week due to loss of a pet, employment issues and problems with his girlfriend. Patient reported his depression to be at a 9 this date and anxiety to be at a 8. Patient did admit to having thoughts of self harm on admission with a plan to harm himself. Collateral obtained from father [name] (who was present ) stated he went to patient's apartment last night on 10/06/15 and found patient in possession of a firearm (shotgun) making statements of self harm. Patient reports having a history of depression and anxiety but has not been on medications for the last two months due to his insurance being discontinued. Patient stated they have been receiving medications from [facility] for the last year (patient cannot remember what practice  [facility] is with or his medication regimen). Patient denies any prior history of self harm or inpatient admission/s. Patient denies any OP treatment for SA issues and states he is an "occansional drinker" although this writer feels patient may be minimizing huis use. Patient also reports periodic use of Cannabis. Patient denies current S/I, H/I or AVH but did admit to thoughts of self harm at the time of incident. Patient denies any previous MH issues but states he has on going depression and anxiety due to multiple stressors. Patient reports he had an acute episode last night with a panic attack where he "collapsed" in front of his dad. Patient reports that father has been staying at the patient's house because he is worried about it. Admission notes state: "Tonight the dad entered the son's room and found him with a shotgun under his chin. The father was able to convince them to put the gun down. The patient left the house and was "going for a walk". The father was concerned that he was "going to go jump off a bridge or something" and went after him. Because was on the phone with the patient the same time and became concerned. He had not been called 911. Police intervened. Patient was brought here for evaluation. Dad went back to the house and cannot find a shotgun. Pt arrives to the ER via [employer] with suicidal ideations; pt's father advised [employer] that the pt had a shotgun to his chin earlier this evening; pt states that he has lost his job and his fiance of 5 years in the last week; pt states 'I don't know how to cope or deal with this";  pt states that he has walked from his house to a bridge several times but has not had the courage to jump; pt states that he has not been taking his medication for the last 1 year and a half; pt reports that he has not been taking his Keppra for the last 2 months; pt states that he has increased his ETOH use in the last few months; pt states that he has been drinking today as  well.Diagnosis: 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 Patient verbalized understanding and agreed to treatment plan. CSW reviewed [facility] "Discharge Process and Patient Involvement" Form. Pt verbalized understanding of information provided and signed form.    Review of initial/current patient goals per problem list:  1. Goal(s): Patient will participate in aftercare plan  Met: Yes  Target date: at discharge  As evidenced by: Patient will participate within aftercare plan AEB aftercare provider and housing plan at discharge being identified.  8/7: Pt to return home; [facility]-up at [facility] per pt request.   2. Goal (s): Patient will exhibit decreased depressive symptoms and suicidal ideations.  Met: Yes   Target date: at discharge  As evidenced by: Patient will utilize self rating of depression at 3 or below and demonstrate decreased signs of depression or be deemed stable for discharge by MD.  8/7: Pt rates depression as 1/10 and presents with pleasant mood/calm affect. Pt denies SI/HI/AVH and was laughing/joking during group with others.   3. Goal(s): Patient will demonstrate decreased signs of withdrawal due to substance abuse  Met:Yes  Target date:at discharge   As evidenced by: Patient will produce a CIWA/COWS score of 0, have stable vitals signs, and no symptoms of withdrawal.  8/7: Pt denies any withdrawals wtoday with CIWA of 3 and stable vitals. Per MD, pt is medically stable for discharge today.  Attendees: Patient:   10/10/2015 10:26 AM   Family:   10/10/2015 10:26 AM   Physician:  Dr. Parke Poisson MD; Dr. Shea Evans MD 10/10/2015 10:26 AM   Nursing:   Meriam Sprague RN 10/10/2015 10:26 AM   Clinical Social Worker: Maxie Better, LCSW 10/10/2015 10:26 AM   Clinical Social Worker: Erasmo Downer  Drinkard LCSW; Peri Maris LCSW 10/10/2015 10:26 AM   Other:  Ricky Ala NP 10/10/2015 10:26 AM   Other:   10/10/2015 10:26 AM   Other:   10/10/2015 10:26 AM   Other:  10/10/2015 10:26 AM   Other:  10/10/2015 10:26 AM   Other:  10/10/2015 10:26 AM    10/10/2015 10:26 AM    10/10/2015 10:26 AM    10/10/2015 10:26 AM    10/10/2015 10:26 AM    Scribe for Treatment Team:   Maxie Better, LCSW 10/10/2015 10:26 AM

## 2015-10-10 NOTE — Discharge Summary (Signed)
Physician Discharge Summary Note  Patient:  Tajee Savant is an 25 y.o., male MRN:  [id number] DOB:  [birth year] Patient phone:  [phone] (home)  Patient address:   [address]  [place] [address],  Total Time spent with patient: 30 minutes  Date of Admission:  10/07/2015 Date of Discharge:  10/10/2015  Reason for Admission:Per H&P- This is an admission assessment for this 25 year old Caucasian male. Admitted to the [facility] adult unit from the [facility] ED with complaints of worsening symptoms of depression triggering suicidal threats with a shot gun. He cited job & relationship problem as the trigger. During this assessment, Quin reports, The cops took me the hospital ED on Friday night. I guess it was because I had an emotional breakdown. I have a lot of depression & stress due to relationship break-up a month ago. After my girl-friend left, I became lonely, depressed & start drinking a lot of alcohol. I was drinking a 12 pack cans of beer daily x 1 month. I don't use drugs. I did have thoughts of hurting myself, but never acted on it. I have never attempted suicide. I have had treatment for depression & anxiety in the past, stopped taking my medicines 3 months ago after I ran out of medicines. I got no insurance. I have a lot in my plates right now".  Principal Problem: Major depressive disorder, recurrent severe without psychotic features [facility]) Discharge Diagnoses: Patient Active Problem List   Diagnosis Date Noted  . Alcohol use disorder, severe, dependence (HCC) [F10.20] 10/09/2015  . Major depressive disorder, recurrent severe without psychotic features (HCC) [F33.2] 10/07/2015  . Polysubstance abuse [F19.10] 10/07/2015     Past Psychiatric History: See Above  Past Medical History:  Past Medical History:  Diagnosis Date  . Anxiety   . Asthma   . Depression   . Irregular heart beat    pt unsure what the irregular rhythm is  . Seizures (HCC)    History  reviewed. No pertinent surgical history. Family History: History reviewed. No pertinent family history. Family Psychiatric  History: See H&P Social History:  History  Alcohol Use  . Yes    Comment: socially     History  Drug Use No    Social History   Social History  . Marital status: Single    Spouse name: N/A  . Number of children: N/A  . Years of education: N/A   Social History Main Topics  . Smoking status: Current Every Day Smoker    Packs/day: 1.00    Types: Cigarettes  . Smokeless tobacco: Never Used  . Alcohol use Yes     Comment: socially  . Drug use: No  . Sexual activity: Not Asked   Other Topics Concern  . None   Social History Narrative  . None    Hospital Course:  Jezreel Justiniano was admitted for Major depressive disorder, recurrent severe without psychotic features (HCC) and crisis management.  Pt was treated discharged with the medications listed below under Medication List.  Medical problems were identified and treated as needed.  Home medications were restarted as appropriate.  Improvement was monitored by observation and Ginger Organ 's daily report of symptom reduction.  Emotional and mental status was monitored by daily self-inventory reports completed by Ginger Organ and clinical staff.         Ginger Organ was evaluated by the treatment team for stability and plans for continued recovery upon discharge. Ginger Organ 's motivation was  an integral factor for scheduling further treatment. Employment, transportation, bed availability, health status, family support, and any pending legal issues were also considered during hospital stay. Pt was offered further treatment options upon discharge including but not limited to Residential, Intensive Outpatient, and Outpatient treatment.  Ginger Organimothy Polito will follow up with the services as listed below under Follow Up Information.     Upon completion of this admission the patient was both  mentally and medically stable for discharge denying suicidal/homicidal ideation, auditory/visual/tactile hallucinations, delusional thoughts and paranoia.    Quirino Surman responded well to treatment with Detox protocol, Remeron , and keppra and Neurontin and prozac without adverse effects. Pt demonstrated improvement without reported or observed adverse effects to the point of stability appropriate for outpatient management. Pertinent labs include: CMP for which outpatient follow-up is necessary for lab recheck as mentioned below. Reviewed CBC, CMP, BAL+170, and UDS+ THC; all unremarkable aside from noted exceptions.   Physical Findings: AIMS: Facial and Oral Movements Muscles of Facial Expression: None, normal Lips and Perioral Area: None, normal Jaw: None, normal Tongue: None, normal,Extremity Movements Upper (arms, wrists, hands, fingers): None, normal Lower (legs, knees, ankles, toes): None, normal, Trunk Movements Neck, shoulders, hips: None, normal, Overall Severity Severity of abnormal movements (highest score from questions above): None, normal Incapacitation due to abnormal movements: None, normal Patient's awareness of abnormal movements (rate only patient's report): No Awareness, Dental Status Current problems with teeth and/or dentures?: No Does patient usually wear dentures?: No  CIWA:  CIWA-Ar Total: 3 COWS:     Musculoskeletal: Strength & Muscle Tone: within normal limits Gait & Station: normal Patient leans: N/A  Psychiatric Specialty Exam: See SRA by MD Physical Exam  Nursing note and vitals reviewed. Constitutional: He is oriented to person, place, and time. He appears well-developed.  HENT:  Head: Normocephalic.  Cardiovascular: Normal rate.   Musculoskeletal: Normal range of motion.  Neurological: He is alert and oriented to person, place, and time.  Psychiatric: He has a normal mood and affect. His behavior is normal.    Review of Systems   Psychiatric/Behavioral: Negative for hallucinations and suicidal ideas. Depression: stable. Nervous/anxious: stable.     Blood pressure 136/83, pulse 72, temperature 97.5 F (36.4 C), temperature source Oral, resp. rate 16, height 5\' 10"  (1.778 m), weight 94.8 kg (209 lb), SpO2 97 %.Body mass index is 29.99 kg/m.    Have you used any form of tobacco in the last 30 days? (Cigarettes, Smokeless Tobacco, Cigars, and/or Pipes): Yes  Has this patient used any form of tobacco in the last 30 days? (Cigarettes, Smokeless Tobacco, Cigars, and/or Pipes) , Yes, A prescription for an FDA-approved tobacco cessation medication was offered at discharge and the patient refused  Blood Alcohol level:  Lab Results  Component Value Date   ETH 170 (H) 10/06/2015   ETH 211 (H) 05/20/2014    Metabolic Disorder Labs:  No results found for: HGBA1C, MPG No results found for: PROLACTIN No results found for: CHOL, TRIG, HDL, CHOLHDL, VLDL, LDLCALC  See Psychiatric Specialty Exam and Suicide Risk Assessment completed by Attending Physician prior to discharge.  Discharge destination:  Home  Is patient on multiple antipsychotic therapies at discharge:  No   Has Patient had three or more failed trials of antipsychotic monotherapy by history:  No  Recommended Plan for Multiple Antipsychotic Therapies: NA  Discharge Instructions    Diet - low sodium heart healthy    Complete by:  As directed   Discharge instructions  Complete by:  As directed   Take all medications as prescribed. Keep all follow-up appointments as scheduled.  Do not consume alcohol or use illegal drugs while on prescription medications. Report any adverse effects from your medications to your primary care provider promptly.  In the event of recurrent symptoms or worsening symptoms, call 911, a crisis hotline, or go to the nearest emergency department for evaluation.   Increase activity slowly    Complete by:  As directed        Medication List    TAKE these medications     Indication  albuterol 108 (90 Base) MCG/ACT inhaler Commonly known as:  PROVENTIL HFA;VENTOLIN HFA Inhale 2 puffs into the lungs every 6 (six) hours as needed for wheezing or shortness of breath.  Indication:  Asthma   FLUoxetine 20 MG capsule Commonly known as:  PROZAC Take 1 capsule (20 mg total) by mouth daily.  Indication:  Depression   gabapentin 300 MG capsule Commonly known as:  NEURONTIN Take 1 capsule (300 mg total) by mouth 3 (three) times daily.  Indication:  Agitation, Alcohol Withdrawal Syndrome   levETIRAcetam 500 MG tablet Commonly known as:  KEPPRA Take 1 tablet (500 mg total) by mouth 2 (two) times daily.  Indication:  Manic Phase of Manic-Depression   mirtazapine 7.5 MG tablet Commonly known as:  REMERON Take 1 tablet (7.5 mg total) by mouth at bedtime.  Indication:  Trouble Sleeping   nicotine 21 mg/24hr patch Commonly known as:  NICODERM CQ - dosed in mg/24 hours Place 1 patch (21 mg total) onto the skin daily.  Indication:  Nicotine Addiction      Follow-up Information    [facility] .   Specialty:  Behavioral Health Why:  Walk-In Clinic at 8AM to receive an appointment time for your assessment for medication management and/or therapy at [facility]. Please go for assessment within 2 days of discharge from the hospital.  Contact information: [address] [place] [address] [phone]           Follow-up recommendations:  Activity:  as tolerated  Diet:  heart healthy  Comments:  Take all medications as prescribed. Keep all follow-up appointments as scheduled.  Do not consume alcohol or use illegal drugs while on prescription medications. Report any adverse effects from your medications to your primary care provider promptly.  In the event of recurrent symptoms or worsening symptoms, call 911, a crisis hotline, or go to the nearest emergency department for evaluation.   Signed: Oneta Rack, NP 10/10/2015, 10:58 AM

## 2015-10-10 NOTE — Progress Notes (Addendum)
D: Pt also denied anxiety, depression, SI, HI, pain or AVH. Pt however endorsed irritability; states, "I having been trying to get my landlord; I have been trying to get my boss on the phone but none of them is call back. Pt was flat and withdrawn to self even while in the dayroom. Pt remained calm and cooperative. A: Medications offered as prescribed.  Support, encouragement, and safe environment provided.  15-minute safety checks continue. R: Pt was med compliant.  Pt did attend AA group. Safety checks continue.

## 2015-10-10 NOTE — Progress Notes (Signed)
  [facility] :  Will you be returning to the same living situation after discharge:  Yes,  home with dad At discharge, do you have transportation home?: Yes,  bus pass in chart Do you have the ability to pay for your medications: Yes,  medication management  Release of information consent forms completed and submitted to medical records.   Patient to Follow up at: Follow-up Information    [facility]   Specialty:  Behavioral Health Why:  Walk-In Clinic at 8AM to receive an appointment time for your assessment for medication management and/[facility]. Please go for assessment within 2 days of discharge from the hospital.  Contact information: [address] [place] [id number] [phone]           Next level of care provider [facility] Link:no  Safety Planning and Suicide Prevention discussed: Yes,  SPE completed with pt; pt declined to consent to family contact. SPI pamphlet and Mobile Crisis information provided to pt and he was encouraged to share information with support network.  Have you used any form of tobacco in the last 30 days? (Cigarettes, Smokeless Tobacco, Cigars, and/or Pipes): Yes  Has patient been referred to the Quitline?: Patient refused referral  Patient has been referred for addiction treatment: Yes  Smart, Shavon Zenz LCSW 10/10/2015, 10:25 AM

## 2015-10-10 NOTE — Progress Notes (Signed)
Discharge Note:  Patient discharged with bus ticket. Patient denied SI and HI.  Denied A/V hallucinations.  Suicide prevention information given and discussed with patient who stated he understood and had no questions.  Patient stated he received all his belongings, toiletries, prescriptions, medications, misc items, clothing.  Patient stated he [facility].  All required discharge information given to patient at discharge.

## 2015-10-10 NOTE — Progress Notes (Signed)
Recreation Therapy Notes  Date: 10/10/15 Time: 0930 Location: 300 Hall Group Room  Group Topic: Stress Management  Goal Area(s) Addresses:  Patient will verbalize importance of using healthy stress management.  Patient will identify positive emotions associated with healthy stress management.   Intervention: Stress Management  Activity :  Forest Visualization.  LRT introduced pt to the technique of guided imagery.  Patients were to follow along as LRT read script to engage in the activity.   Education:  Stress Management, Discharge Planning.   Clinical Observations/Feedback: Pt did not attend group.     Linsie Lupo, LRT/CTRS  

## 2015-10-10 NOTE — [facility] Suicide Risk Assessment (Signed)
[facility]   Principal Problem: Major depressive disorder, recurrent severe without psychotic features [facility](HCC) Discharge Diagnoses:  Patient Active Problem List   Diagnosis Date Noted  . Alcohol use disorder, severe, dependence (HCC) [F10.20] 10/09/2015  . Major depressive disorder, recurrent severe without psychotic features (HCC) [F33.2] 10/07/2015  . Polysubstance abuse [F19.10] 10/07/2015    Total Time spent with patient: 30 minutes  Musculoskeletal: Strength & Muscle Tone: within normal limits Gait & Station: normal Patient leans: N/A  Psychiatric Specialty Exam: Review of Systems  Psychiatric/Behavioral: Positive for substance abuse. Negative for depression.  All other systems reviewed and are negative.   Blood pressure 136/83, pulse 72, temperature 97.5 F (36.4 C), temperature source Oral, resp. rate 16, height 5\' 10"  (1.778 m), weight 94.8 kg (209 lb), SpO2 97 %.Body mass index is 29.99 kg/m.  General Appearance: Casual  Eye Contact::  Fair  Speech:  Clear and Coherent409  Volume:  Normal  Mood:  Euthymic  Affect:  Appropriate  Thought Process:  Goal Directed and Descriptions of Associations: Intact  Orientation:  Full (Time, Place, and Person)  Thought Content:  Logical  Suicidal Thoughts:  No  Homicidal Thoughts:  No  Memory:  Immediate;   Fair Recent;   Fair Remote;   Fair  Judgement:  Fair  Insight:  Fair  Psychomotor Activity:  Normal  Concentration:  Fair  Recall:  [employer]Fair  Fund of Knowledge:Fair  Language: Fair  Akathisia:  No  Handed:  Right  AIMS (if indicated):     Assets:  Desire for Improvement  Sleep:  Number of Hours: 4.5  Cognition: WNL  ADL's:  Intact   Mental Status Per Nursing Assessment::   On Admission:  NA  Demographic Factors:  Male and Caucasian  Loss Factors: NA  Historical Factors: Impulsivity  Risk Reduction Factors:   Positive social support  Continued Clinical Symptoms:   Alcohol/Substance Abuse/Dependencies Previous Psychiatric Diagnoses and Treatments  Cognitive Features That Contribute To Risk:  Polarized thinking    Suicide Risk:  Minimal: No identifiable suicidal ideation.  Patients presenting with no risk factors but with morbid ruminations; may be classified as minimal risk based on the severity of the depressive symptoms  Follow-up Information    [facility]   Specialty:  Behavioral Health Why:  On the day following your hospital discharge, [facility] to receive an appointment time for your assessment for medication management and[facility]. Contact information: [address] [place] [address] [phone]           Plan Of Care/Follow-up recommendations:  Activity:  no restrictions Diet:  regular Tests:  as needed Other:  follow up with aftercare  Annah Jasko, MD 10/10/2015, 10:01 AM

## 2015-10-10 NOTE — BHH Group Notes (Signed)
Pt attended spiritual care group on grief and loss facilitated by chaplain Selia Wareing   Group opened with brief discussion and psycho-social ed around grief and loss in relationships and in relation to self - identifying life patterns, circumstances, changes that cause losses. Established group norm of speaking from own life experience. Group goal of establishing open and affirming space for members to share loss and experience with grief, normalize grief experience and provide psycho social education and grief support.   Group facilitation drew on Narrative, Adlerian, and brief CBT frameworks.  

## 2015-10-28 ENCOUNTER — Emergency Department (HOSPITAL_COMMUNITY): Payer: Self-pay

## 2015-10-28 ENCOUNTER — Encounter (HOSPITAL_COMMUNITY): Payer: Self-pay | Admitting: Emergency Medicine

## 2015-10-28 ENCOUNTER — Emergency Department (HOSPITAL_COMMUNITY)
Admission: EM | Admit: 2015-10-28 | Discharge: 2015-10-29 | Disposition: A | Discharge status: Home / Self Care | Payer: Self-pay | Attending: Emergency Medicine | Admitting: Emergency Medicine

## 2015-10-28 DIAGNOSIS — G40909 Epilepsy, unspecified, not intractable, without status epilepticus: Secondary | ICD-10-CM | POA: Insufficient documentation

## 2015-10-28 DIAGNOSIS — Z79899 Other long term (current) drug therapy: Secondary | ICD-10-CM | POA: Insufficient documentation

## 2015-10-28 DIAGNOSIS — J45909 Unspecified asthma, uncomplicated: Secondary | ICD-10-CM | POA: Insufficient documentation

## 2015-10-28 DIAGNOSIS — R51 Headache: Secondary | ICD-10-CM

## 2015-10-28 DIAGNOSIS — R519 Headache, unspecified: Secondary | ICD-10-CM

## 2015-10-28 DIAGNOSIS — F17210 Nicotine dependence, cigarettes, uncomplicated: Secondary | ICD-10-CM | POA: Insufficient documentation

## 2015-10-28 DIAGNOSIS — R569 Unspecified convulsions: Secondary | ICD-10-CM

## 2015-10-28 LAB — CBG MONITORING, ED: Glucose-Capillary: 112 mg/dL — ABNORMAL HIGH (ref 65–99)

## 2015-10-28 LAB — COMPREHENSIVE METABOLIC PANEL
ALT: 36 U/L (ref 17–63)
AST: 22 U/L (ref 15–41)
Albumin: 4.7 g/dL (ref 3.5–5.0)
Alkaline Phosphatase: 57 U/L (ref 38–126)
Anion gap: 10 (ref 5–15)
BILIRUBIN TOTAL: 0.3 mg/dL (ref 0.3–1.2)
BUN: 14 mg/dL (ref 6–20)
CALCIUM: 9.4 mg/dL (ref 8.9–10.3)
CO2: 21 mmol/L — ABNORMAL LOW (ref 22–32)
CREATININE: 0.91 mg/dL (ref 0.61–1.24)
Chloride: 107 mmol/L (ref 101–111)
Glucose, Bld: 96 mg/dL (ref 65–99)
Potassium: 3.8 mmol/L (ref 3.5–5.1)
Sodium: 138 mmol/L (ref 135–145)
TOTAL PROTEIN: 8.1 g/dL (ref 6.5–8.1)

## 2015-10-28 LAB — CBC WITH DIFFERENTIAL/PLATELET
Basophils Absolute: 0 10*3/uL (ref 0.0–0.1)
Basophils Relative: 0 %
Eosinophils Absolute: 0.2 10*3/uL (ref 0.0–0.7)
Eosinophils Relative: 2 %
HEMATOCRIT: 43.6 % (ref 39.0–52.0)
HEMOGLOBIN: 15.8 g/dL (ref 13.0–17.0)
Lymphocytes Relative: 24 %
Lymphs Abs: 2.2 10*3/uL (ref 0.7–4.0)
MCH: 31.7 pg (ref 26.0–34.0)
MCHC: 36.2 g/dL — AB (ref 30.0–36.0)
MCV: 87.6 fL (ref 78.0–100.0)
MONOS PCT: 4 %
Monocytes Absolute: 0.4 10*3/uL (ref 0.1–1.0)
NEUTROS ABS: 6.5 10*3/uL (ref 1.7–7.7)
Neutrophils Relative %: 70 %
Platelets: 246 10*3/uL (ref 150–400)
RBC: 4.98 MIL/uL (ref 4.22–5.81)
RDW: 12.6 % (ref 11.5–15.5)
WBC: 9.4 10*3/uL (ref 4.0–10.5)

## 2015-10-28 LAB — RAPID URINE DRUG SCREEN, HOSP PERFORMED
AMPHETAMINES: NOT DETECTED
BARBITURATES: NOT DETECTED
Benzodiazepines: NOT DETECTED
COCAINE: NOT DETECTED
Opiates: NOT DETECTED
TETRAHYDROCANNABINOL: NOT DETECTED

## 2015-10-28 LAB — ACETAMINOPHEN LEVEL: Acetaminophen (Tylenol), Serum: <10 ug/mL — ABNORMAL LOW (ref 10–30)

## 2015-10-28 LAB — SALICYLATE LEVEL: Salicylate Lvl: <4 mg/dL (ref 2.8–30.0)

## 2015-10-28 LAB — ETHANOL: ALCOHOL ETHYL (B): 122 mg/dL — AB (ref <5)

## 2015-10-28 MED ORDER — SODIUM CHLORIDE 0.9 % IV SOLN
1000.0000 mg | Freq: Once | INTRAVENOUS | Status: AC
  Administered 2015-10-29: 1000 mg via INTRAVENOUS
  Filled 2015-10-28: qty 10

## 2015-10-28 MED ORDER — LORAZEPAM 2 MG/ML IJ SOLN
1.0000 mg | Freq: Once | INTRAMUSCULAR | Status: AC
  Administered 2015-10-29: 1 mg via INTRAVENOUS
  Filled 2015-10-28: qty 1

## 2015-10-28 MED ORDER — MORPHINE SULFATE (PF) 4 MG/ML IV SOLN
4.0000 mg | Freq: Once | INTRAVENOUS | Status: AC
  Administered 2015-10-29: 4 mg via INTRAVENOUS
  Filled 2015-10-28: qty 1

## 2015-10-28 MED ORDER — LORAZEPAM 2 MG/ML IJ SOLN
1.0000 mg | Freq: Once | INTRAMUSCULAR | Status: AC
  Administered 2015-10-28: 1 mg via INTRAVENOUS
  Filled 2015-10-28: qty 1

## 2015-10-28 MED ORDER — LEVETIRACETAM 500 MG PO TABS
500.0000 mg | ORAL_TABLET | Freq: Once | ORAL | Status: AC
  Administered 2015-10-28: 500 mg via ORAL
  Filled 2015-10-28: qty 1

## 2015-10-28 NOTE — ED Provider Notes (Signed)
MSE was initiated and I personally evaluated the patient and placed orders (if any) at  10:30 PM on October 28, 2015.  The patient appears stable so that the remainder of the MSE may be completed by another provider.  Called to exam room for "seizures." Pt laying with eyes closed, intermittently "twitching" his bilat LE's. While he was doing this, I asked pt to open his eyes and he opened them, looked at me, then squeezed his eyes shut and stopped moving his LE's. Pt told EMS and ED RN she has not been taking his keppra. Orders placed, as well as IV ativan and PO keppra.     Samuel JesterKathleen Albie Bazin, DO 10/28/15 2304

## 2015-10-28 NOTE — ED Provider Notes (Signed)
[facility]-EMERGENCY DEPT Provider Note   CSN: [id number] Arrival date & time: 10/28/15  2149  By signing my name below, I, Freida Busman, attest that this documentation has been prepared under the direction and in the presence of Azalia Bilis, MD . Electronically Signed: Freida Busman, Scribe. 10/28/2015. 11:53 PM.  History   Chief Complaint Chief Complaint  Patient presents with  . Seizures  . Migraine    The history is provided by the patient. No language interpreter was used.    HPI Comments:  Melvin Barry is a 25 y.o. male brought in by ambulance, who presents to the Emergency Department complaining of 2 witnessed seizures this evening. Pt was watching a sports game when he had his first seizure.  Pt has a h/o seizures and has missed a week of Keppra as he ran out. He states he re-started taking it this evening (500mg  BID). He notes associated HA but states he has been experiencing the same HA x 2 days. He also notes body tremors and weakness in the right arm x 3 days; reports mild pain and twitching in the extremity. No alleviating factors noted.    Past Medical History:  Diagnosis Date  . Anxiety   . Asthma   . Depression   . Irregular heart beat    pt unsure what the irregular rhythm is  . Seizures [facility])     Patient Active Problem List   Diagnosis Date Noted  . Alcohol use disorder, severe, dependence (HCC) 10/09/2015  . Major depressive disorder, recurrent severe without psychotic features (HCC) 10/07/2015  . Polysubstance abuse 10/07/2015    History reviewed. No pertinent surgical history.    Home Medications    Prior to Admission medications   Medication Sig Start Date End Date Taking? Authorizing Provider  FLUoxetine (PROZAC) 20 MG capsule Take 1 capsule (20 mg total) by mouth daily. 10/10/15  Yes Oneta Rack, NP  gabapentin (NEURONTIN) 300 MG capsule Take 1 capsule (300 mg total) by mouth 3 (three) times daily. 10/10/15  Yes Oneta Rack, NP    levETIRAcetam (KEPPRA) 500 MG tablet Take 1 tablet (500 mg total) by mouth 2 (two) times daily. 02/21/15  Yes Lorre Nick, MD  mirtazapine (REMERON) 7.5 MG tablet Take 1 tablet (7.5 mg total) by mouth at bedtime. 10/10/15  Yes Oneta Rack, NP  albuterol (PROVENTIL HFA;VENTOLIN HFA) 108 (90 BASE) MCG/ACT inhaler Inhale 2 puffs into the lungs every 6 (six) hours as needed for wheezing or shortness of breath.    Historical Provider, MD  nicotine (NICODERM CQ - DOSED IN MG/24 HOURS) 21 mg/24hr patch Place 1 patch (21 mg total) onto the skin daily. Patient not taking: Reported on 10/28/2015 10/10/15   Oneta Rack, NP    Family History No family history on file.  Social History Social History  Substance Use Topics  . Smoking status: Current Every Day Smoker    Packs/day: 1.00    Types: Cigarettes  . Smokeless tobacco: Never Used  . Alcohol use Yes     Comment: socially     Allergies   Review of patient's allergies indicates no known allergies.   Review of Systems Review of Systems 10 systems reviewed and all are negative for acute change except as noted in the HPI.   Physical Exam Updated Vital Signs BP 120/55   Pulse 84   Temp 98.1 F (36.7 C) (Oral)   Resp 19   SpO2 97%   Physical Exam  Constitutional: He  is oriented to person, place, and time. He appears well-developed and well-nourished.  HENT:  Head: Normocephalic and atraumatic.  Eyes: EOM are normal. Pupils are equal, round, and reactive to light.  Neck: Normal range of motion.  Cardiovascular: Normal rate, regular rhythm, normal heart sounds and intact distal pulses.   Pulmonary/Chest: Effort normal and breath sounds normal. No respiratory distress.  Abdominal: Soft. He exhibits no distension. There is no tenderness.  Musculoskeletal: Normal range of motion.  Neurological: He is alert and oriented to person, place, and time.  5/5 strength in major muscle groups of  bilateral upper and lower extremities.  Speech normal. No facial asymetry.   Skin: Skin is warm and dry.  Psychiatric: He has a normal mood and affect. Judgment normal.  Nursing note and vitals reviewed.    ED Treatments / Results  DIAGNOSTIC STUDIES:  Oxygen Saturation is 94% on RA, adequate by my interpretation.    COORDINATION OF CARE:  11:47 PM Discussed treatment plan with pt at bedside and pt agreed to plan.  Labs (all labs ordered are listed, but only abnormal results are displayed) Labs Reviewed  ACETAMINOPHEN LEVEL - Abnormal; Notable for the following:       Result Value   Acetaminophen (Tylenol), Serum <10 (*)    All other components within normal limits  COMPREHENSIVE METABOLIC PANEL - Abnormal; Notable for the following:    CO2 21 (*)    All other components within normal limits  ETHANOL - Abnormal; Notable for the following:    Alcohol, Ethyl (B) 122 (*)    All other components within normal limits  CBC WITH DIFFERENTIAL/PLATELET - Abnormal; Notable for the following:    MCHC 36.2 (*)    All other components within normal limits  CBG MONITORING, ED - Abnormal; Notable for the following:    Glucose-Capillary 112 (*)    All other components within normal limits  SALICYLATE LEVEL  URINE RAPID DRUG SCREEN, HOSP PERFORMED    EKG  EKG Interpretation None       Radiology Ct Head Wo Contrast  Result Date: 10/29/2015 CLINICAL DATA:  Headache, seizure today. EXAM: CT HEAD WITHOUT CONTRAST TECHNIQUE: Contiguous axial images were obtained from the base of the skull through the vertex without intravenous contrast. COMPARISON:  Head CT 02/21/2015 FINDINGS: Brain: No evidence of acute infarction, hemorrhage, hydrocephalus, extra-axial collection or mass lesion/mass effect. Vascular: No hyperdense vessel or unexpected calcification. Skull: No fracture or focal lesion. Sinuses/Orbits: No acute finding. IMPRESSION: Unremarkable noncontrast head CT. Electronically Signed   By: Rubye Oaks M.D.   On:  10/29/2015 00:15    Procedures Procedures (including critical care time)  Medications Ordered in ED Medications  levETIRAcetam (KEPPRA) tablet 500 mg (500 mg Oral Given 10/28/15 2247)  LORazepam (ATIVAN) injection 1 mg (1 mg Intravenous Given 10/28/15 2242)  levETIRAcetam (KEPPRA) 1,000 mg in sodium chloride 0.9 % 100 mL IVPB (0 mg Intravenous Stopped 10/29/15 0111)  LORazepam (ATIVAN) injection 1 mg (1 mg Intravenous Given 10/29/15 0055)  morphine 4 MG/ML injection 4 mg (4 mg Intravenous Given 10/29/15 0051)     Initial Impression / Assessment and Plan / ED Course  I have reviewed the triage vital signs and the nursing notes.  Pertinent labs & imaging results that were available during my care of the patient were reviewed by me and considered in my medical decision making (see chart for details).  Clinical Course    Pt well appearing. HA resolved. Head CT negative. Dc home.  IV load of keppra in ER. Outpatient neurology follow up  Final Clinical Impressions(s) / ED Diagnoses   Final diagnoses:  Seizure (HCC)  Headache, unspecified headache type    New Prescriptions New Prescriptions   No medications on file    I personally performed the services described in this documentation, which was scribed in my presence. The recorded information has been reviewed and is accurate.        Azalia BilisKevin Latrelle Fuston, MD 10/29/15 [phone]

## 2015-10-28 NOTE — ED Triage Notes (Signed)
[facility] Per EMS, pt stated twitches have been going on all day, he feels different today. Pt stated his twitches indicate he is going to have another seizure. Pt had two seizures prior to EMS arriving. Pt had Zofran for nausea. Pt was showing possible seizure symptoms en route to hospital. VS stable.

## 2015-10-28 NOTE — ED Notes (Signed)
Bed: WA19 Expected date:  Expected time:  Means of arrival:  Comments: Seizures  

## 2015-10-30 ENCOUNTER — Encounter (HOSPITAL_COMMUNITY): Payer: Self-pay | Admitting: Emergency Medicine

## 2015-10-30 ENCOUNTER — Emergency Department (HOSPITAL_COMMUNITY)
Admission: EM | Admit: 2015-10-30 | Discharge: 2015-10-31 | Disposition: A | Discharge status: Home / Self Care | Payer: Self-pay | Attending: Emergency Medicine | Admitting: Emergency Medicine

## 2015-10-30 DIAGNOSIS — G43809 Other migraine, not intractable, without status migrainosus: Secondary | ICD-10-CM | POA: Insufficient documentation

## 2015-10-30 DIAGNOSIS — R569 Unspecified convulsions: Secondary | ICD-10-CM

## 2015-10-30 DIAGNOSIS — J45909 Unspecified asthma, uncomplicated: Secondary | ICD-10-CM | POA: Insufficient documentation

## 2015-10-30 DIAGNOSIS — G40909 Epilepsy, unspecified, not intractable, without status epilepticus: Secondary | ICD-10-CM | POA: Insufficient documentation

## 2015-10-30 DIAGNOSIS — F17210 Nicotine dependence, cigarettes, uncomplicated: Secondary | ICD-10-CM | POA: Insufficient documentation

## 2015-10-30 DIAGNOSIS — Z79899 Other long term (current) drug therapy: Secondary | ICD-10-CM | POA: Insufficient documentation

## 2015-10-30 LAB — CBG MONITORING, ED: GLUCOSE-CAPILLARY: 99 mg/dL (ref 65–99)

## 2015-10-30 MED ORDER — DIPHENHYDRAMINE HCL 50 MG/ML IJ SOLN
25.0000 mg | Freq: Once | INTRAMUSCULAR | Status: AC
  Administered 2015-10-30: 25 mg via INTRAVENOUS
  Filled 2015-10-30: qty 1

## 2015-10-30 MED ORDER — PROCHLORPERAZINE EDISYLATE 5 MG/ML IJ SOLN
10.0000 mg | Freq: Once | INTRAMUSCULAR | Status: AC
  Administered 2015-10-30: 10 mg via INTRAVENOUS
  Filled 2015-10-30: qty 2

## 2015-10-30 MED ORDER — SODIUM CHLORIDE 0.9 % IV SOLN
1000.0000 mg | Freq: Once | INTRAVENOUS | Status: AC
  Administered 2015-10-31: 1000 mg via INTRAVENOUS
  Filled 2015-10-30: qty 10

## 2015-10-30 NOTE — ED Triage Notes (Signed)
Pt brought from home via EMS.  Friends of pt states they were walking and pt had an episode of syncope.  Eased to the ground by friend.  Friend also states he had a seizure while being carried into the house and another while returning from using the restroom.  Lethargic and light sensitivity in route.  Alert and oriented at scene and at this time. Initial CBG of 47 on scene.  Pt denies hx of diabetes but states he has not eaten much lately.  States he takes medication for seizures but has not taken them in the last week due to being unable to afford them.  Also states he has a brain tumor that he is not being treated for due to lack of insurance and funds. Positive for nausea and headache.  4 mg of zofran and 15 g oral glucose given in route. Last CBG had came up to 96.

## 2015-10-30 NOTE — ED Triage Notes (Signed)
Pt stated he has been out of his keppra for a week but finally got it two days ago and has taken it the past two days.

## 2015-10-30 NOTE — ED Provider Notes (Signed)
[facility]-EMER[facility]ENCY DEPT Provider Note   CSN: [id number] Arrival date & time: 10/30/15  2204  By signing my name below, I, Nelwyn Salisbury, attest that this documentation has been prepared under the direction and in the presence of Melene Plan, DO . Electronically Signed: Nelwyn Salisbury, Scribe. 10/31/2015. 12:41 AM.  History   Chief Complaint Chief Complaint  Patient presents with  . Seizures   The history is provided by the patient. No language interpreter was used.     HPI Comments:  Melvin Barry is a 25 y.o. male with PMHx of seizures who presents to the Emergency Department complaining of sudden-onset unchanged headache onset earlier today. Pt reports he is not having any trouble walking or talking but was earlier. He denies any recent dizziness. Pt reports that he has been compliant with his Keppra medication    Past Medical History:  Diagnosis Date  . Anxiety   . Asthma   . Depression   . Irregular heart beat    pt unsure what the irregular rhythm is  . Seizures [facility])     Patient Active Problem List   Diagnosis Date Noted  . Alcohol use disorder, severe, dependence (HCC) 10/09/2015  . Major depressive disorder, recurrent severe without psychotic features (HCC) 10/07/2015  . Polysubstance abuse 10/07/2015    History reviewed. No pertinent surgical history.     Home Medications    Prior to Admission medications   Medication Sig Start Date End Date Taking? Authorizing Provider  albuterol (PROVENTIL HFA;VENTOLIN HFA) 108 (90 BASE) MCG/ACT inhaler Inhale 2 puffs into the lungs every 6 (six) hours as needed for wheezing or shortness of breath.    Historical Provider, MD  FLUoxetine (PROZAC) 20 MG capsule Take 1 capsule (20 mg total) by mouth daily. 10/10/15   Oneta Rack, NP  gabapentin (NEURONTIN) 300 MG capsule Take 1 capsule (300 mg total) by mouth 3 (three) times daily. 10/10/15   Oneta Rack, NP  levETIRAcetam (KEPPRA) 500 MG tablet Take 1 tablet (500 mg total) by  mouth 2 (two) times daily. 02/21/15   Lorre Nick, MD  mirtazapine (REMERON) 7.5 MG tablet Take 1 tablet (7.5 mg total) by mouth at bedtime. 10/10/15   Oneta Rack, NP  nicotine (NICODERM CQ - DOSED IN MG/24 HOURS) 21 mg/24hr patch Place 1 patch (21 mg total) onto the skin daily. Patient not taking: Reported on 10/28/2015 10/10/15   Oneta Rack, NP    Family History No family history on file.  Social History Social History  Substance Use Topics  . Smoking status: Current Every Day Smoker    Packs/day: 0.50    Types: Cigarettes  . Smokeless tobacco: Never Used  . Alcohol use Yes     Comment: occasionally      Allergies   Review of patient's allergies indicates no known allergies.   Review of Systems Review of Systems  Neurological: Positive for headaches. Negative for dizziness and speech difficulty.  All other systems reviewed and are negative.    Physical Exam Updated Vital Signs BP 111/66   Pulse 73   Temp 98.1 F (36.7 C) (Oral)   Resp 18   Ht 5\' 9"  (1.753 m)   Wt 200 lb (90.7 kg)   SpO2 96%   BMI 29.53 kg/m   Physical Exam  Constitutional: He is oriented to person, place, and time. He appears well-developed and well-nourished.  HENT:  Head: Normocephalic and atraumatic.  Eyes: Pupils are equal, round, and reactive to light.  Cardiovascular: Regular rhythm.   Pulmonary/Chest: Effort normal.  Abdominal: Soft.  Musculoskeletal: Normal range of motion.  Neurological: He is alert and oriented to person, place, and time. He has normal strength. No cranial nerve deficit or sensory deficit. He displays a negative Romberg sign. Coordination and gait normal. GCS eye subscore is 4. GCS verbal subscore is 5. GCS motor subscore is 6. He displays no Babinski's sign on the right side. He displays no Babinski's sign on the left side.  Reflex Scores:      Tricep reflexes are 2+ on the right side and 2+ on the left side.      Bicep reflexes are 2+ on the right side and  2+ on the left side.      Brachioradialis reflexes are 2+ on the right side and 2+ on the left side.      Patellar reflexes are 2+ on the right side and 2+ on the left side.      Achilles reflexes are 2+ on the right side and 2+ on the left side. 5/5 strength in major muscle groups of  bilateral upper and lower extremities. Speech normal. No facial asymetry.   Nursing note and vitals reviewed.    ED Treatments / Results  DIAGNOSTIC STUDIES:  Oxygen Saturation is 97% on RA, normal by my interpretation.    COORDINATION OF CARE:  11:02 PM Discussed treatment plan with pt at bedside which included headache cocktail and keppra and pt agreed to plan.  Labs (all labs ordered are listed, but only abnormal results are displayed) Labs Reviewed  CBG MONITORING, ED  CBG MONITORING, ED    EKG  EKG Interpretation None       Radiology No results found.  Procedures Procedures (including critical care time)  Medications Ordered in ED Medications  prochlorperazine (COMPAZINE) injection 10 mg (10 mg Intravenous Given 10/30/15 2345)  diphenhydrAMINE (BENADRYL) injection 25 mg (25 mg Intravenous Given 10/30/15 2345)  levETIRAcetam (KEPPRA) 1,000 mg in sodium chloride 0.9 % 100 mL IVPB (1,000 mg Intravenous New Bag/Given 10/31/15 0015)     Initial Impression / Assessment and Plan / ED Course  I have reviewed the triage vital signs and the nursing notes.  Pertinent labs & imaging results that were available during my care of the patient were reviewed by me and considered in my medical decision making (see chart for details).  Clinical Course    25 yo M with a cc of a Headache that is typical to his normal and a seizure. Patient had been out of his Keppra now is taking it. Patient has a benign neuro exam see no reason for repeat imaging at this time. Patient's headache was treated with a headache cocktail and feels much better. Loaded with Keppra. Discharge home.  Final Clinical  Impressions(s) / ED Diagnoses   Final diagnoses:  Seizure (HCC)  Other migraine without status migrainosus, not intractable    New Prescriptions New Prescriptions   No medications on file  I personally performed the services described in this documentation, which was scribed in my presence. The recorded information has been reviewed and is accurate.      Melene Planan Quantae Martel, DO 10/31/15 0041

## 2015-10-30 NOTE — ED Notes (Signed)
Bed: RESA Expected date:  Expected time:  Means of arrival:  Comments: EMS 25 yo with seizure

## 2015-10-31 NOTE — ED Notes (Signed)
Pt ambulatory and independent at discharge.  Verbalized understanding of discharge instructions 

## 2015-12-30 ENCOUNTER — Ambulatory Visit: Payer: Self-pay | Admitting: Neurology

## 2016-05-03 IMAGING — CT CT CERVICAL SPINE W/O CM
4 of 6 series · 13 of 33 positions shown, 15 images · non-contrast
Comparison: CT head and CT cervical spine May 20, 2014

CLINICAL DATA: Pain following fall.  History of seizures

EXAM:
CT HEAD WITHOUT CONTRAST
CT CERVICAL SPINE WITHOUT CONTRAST
TECHNIQUE: Multidetector CT imaging of the head and cervical spine was
performed following the standard protocol without intravenous
contrast. Multiplanar CT image reconstructions of the cervical spine
were also generated.

[Series 5: c-spine st · axial · 0.26mm/px · z∈[-252,-198]mm · 2 of 81 slices shown]
[im 27/81  bone]
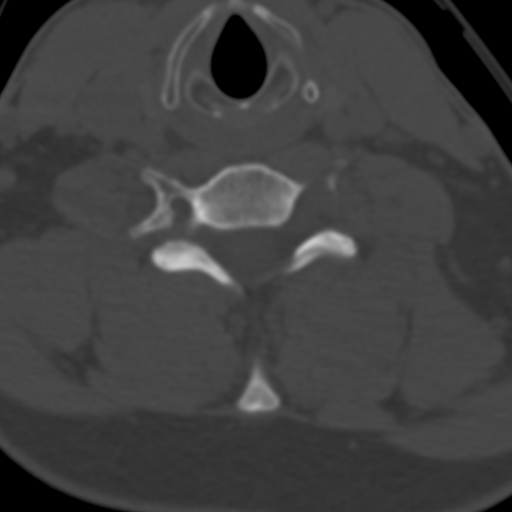
[im 54/81  bone]
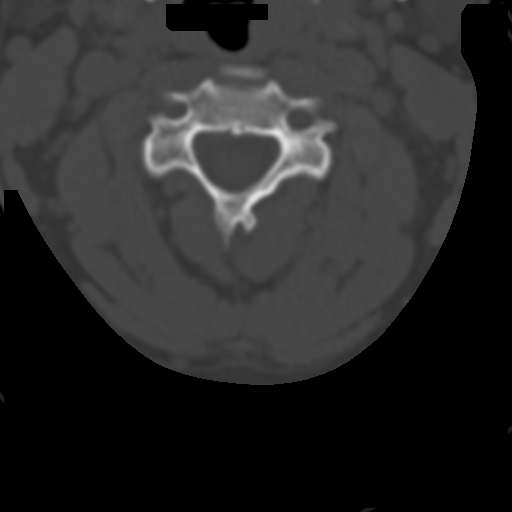

[Series 8: axial recon · axial · 0.25mm/px · z∈[-299,-200]mm · 3 of 104 slices shown, 4 images]
[im 26/104  soft-tissue]
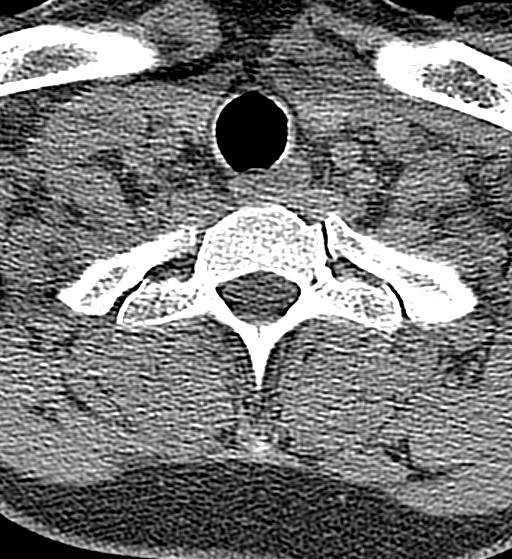
[im 26/104  bone]
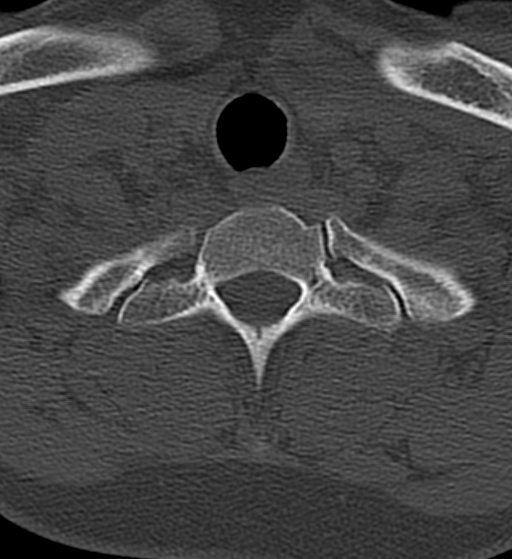
[im 52/104  bone]
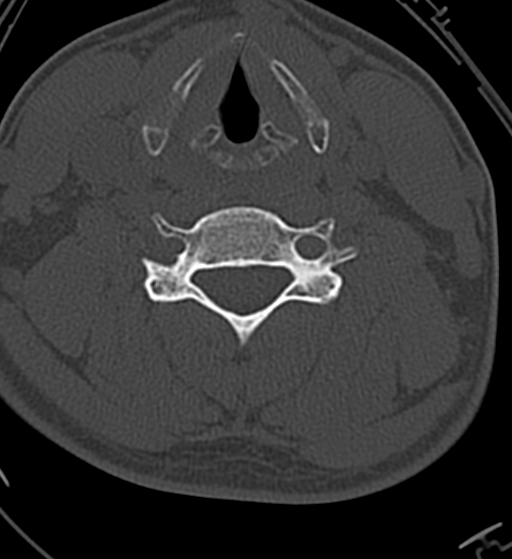
[im 78/104  bone]
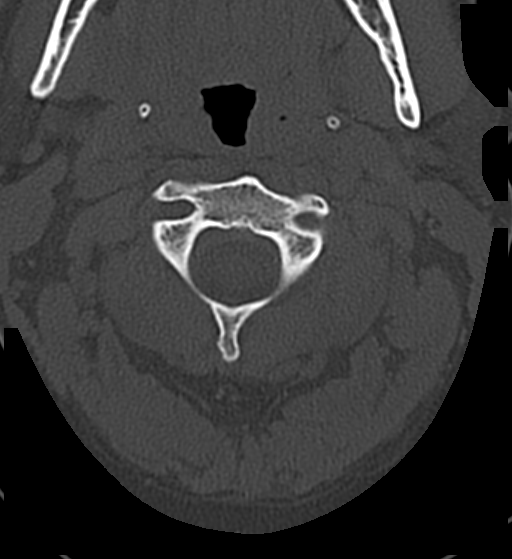

[Series 9: coronal · coronal · 0.24mm/px · 3 of 69 slices shown]
[im 14/69  bone]
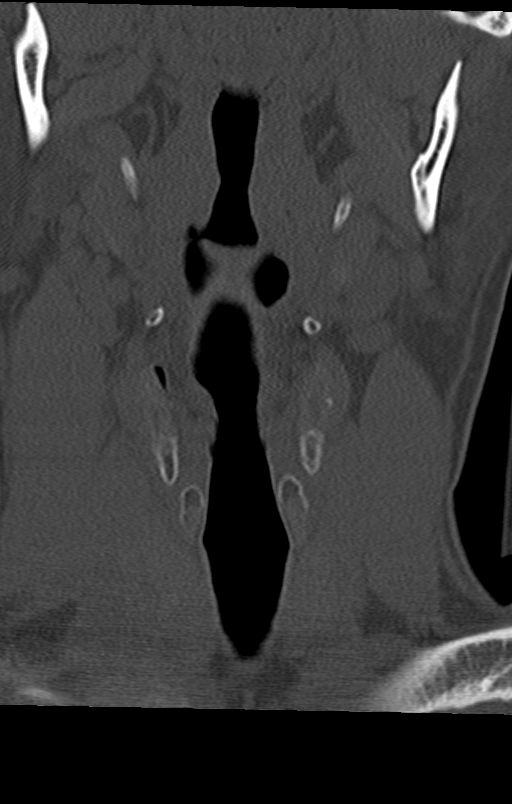
[im 28/69  bone]
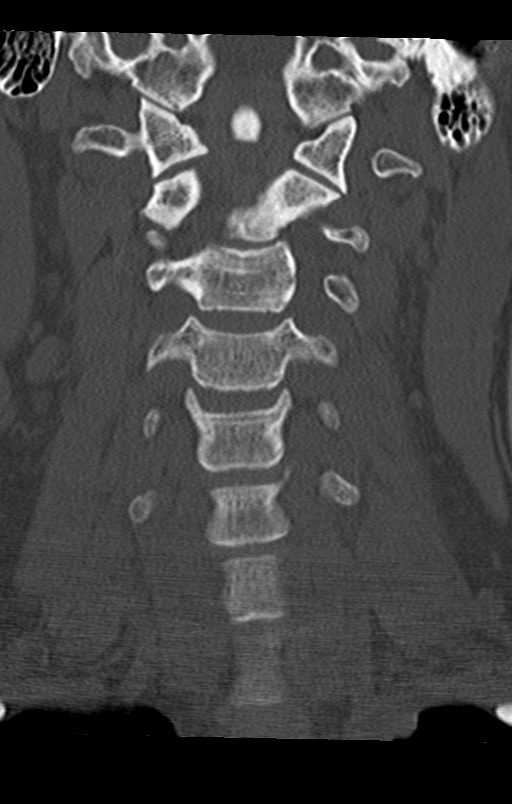
[im 41/69  bone]
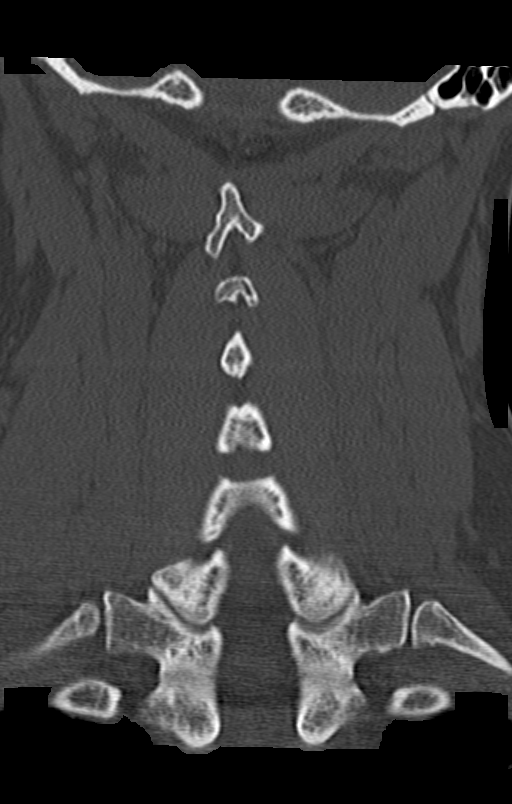

[Series 10: sagittal · sagittal · 0.24mm/px · 5 of 70 slices shown, 6 images]
[im 24/70  bone]
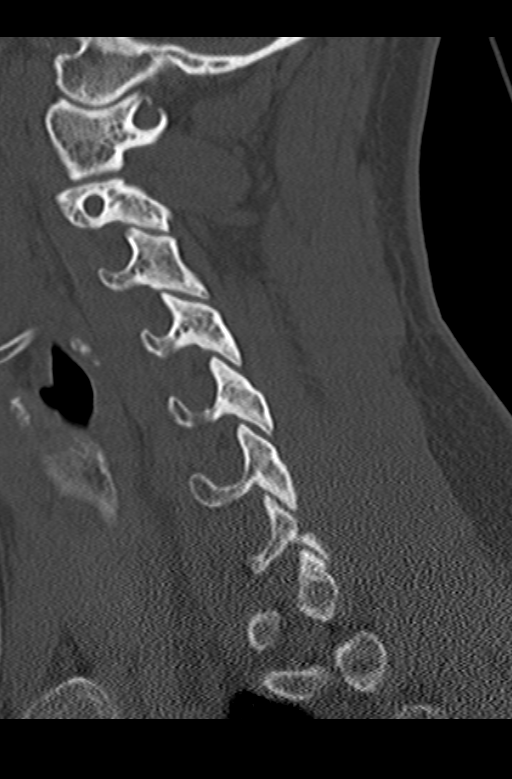
[im 29/70  bone]
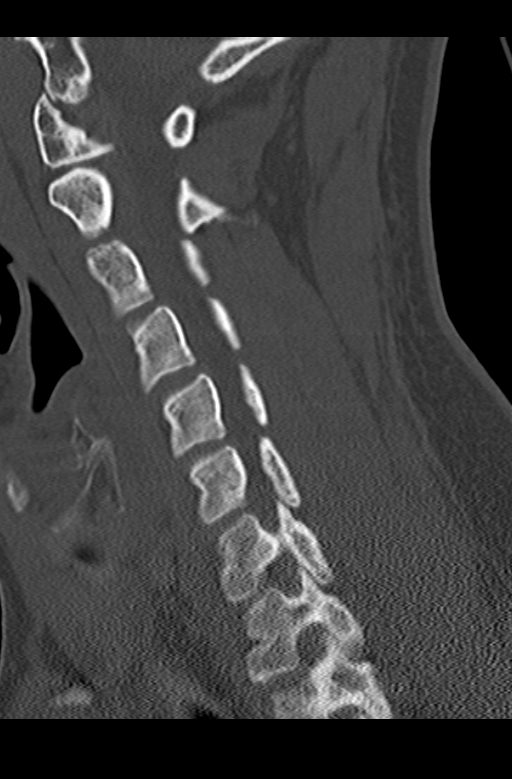
[im 35/70  soft-tissue]
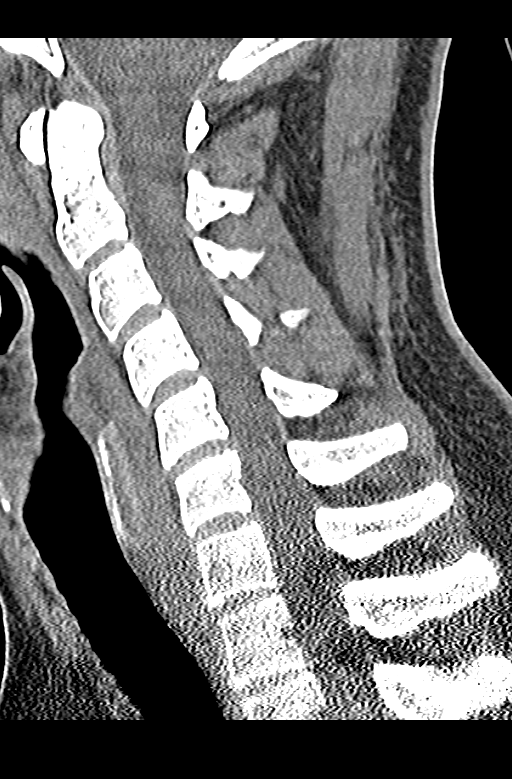
[im 35/70  bone]
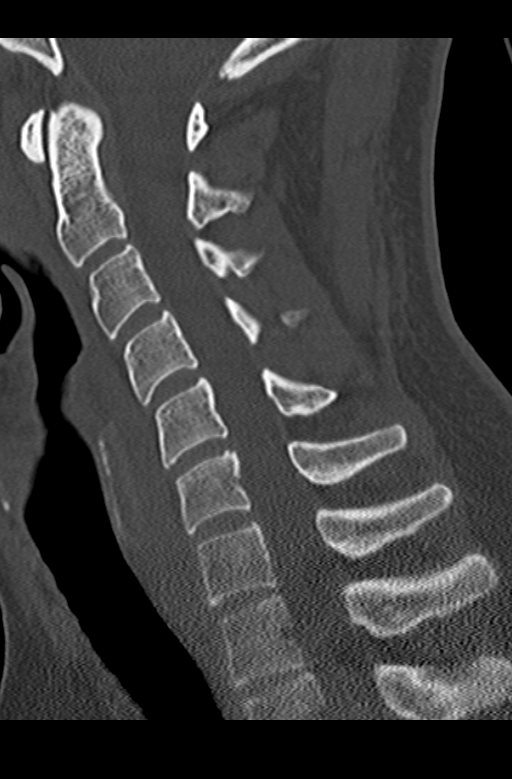
[im 41/70  bone]
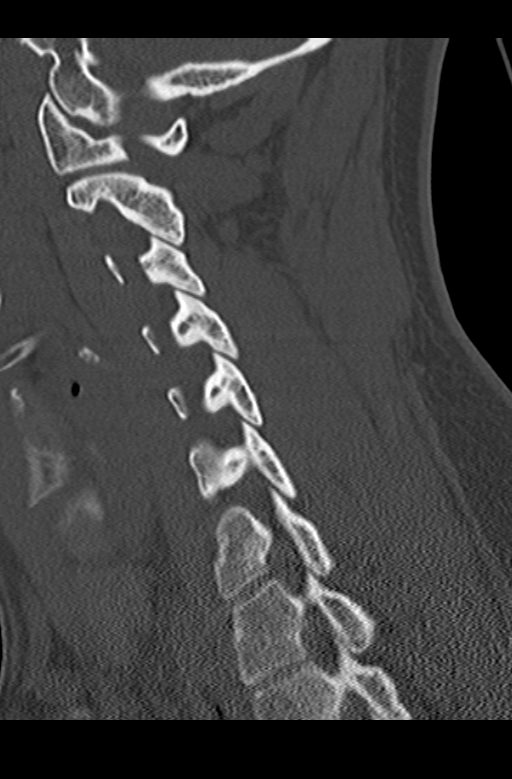
[im 47/70  bone]
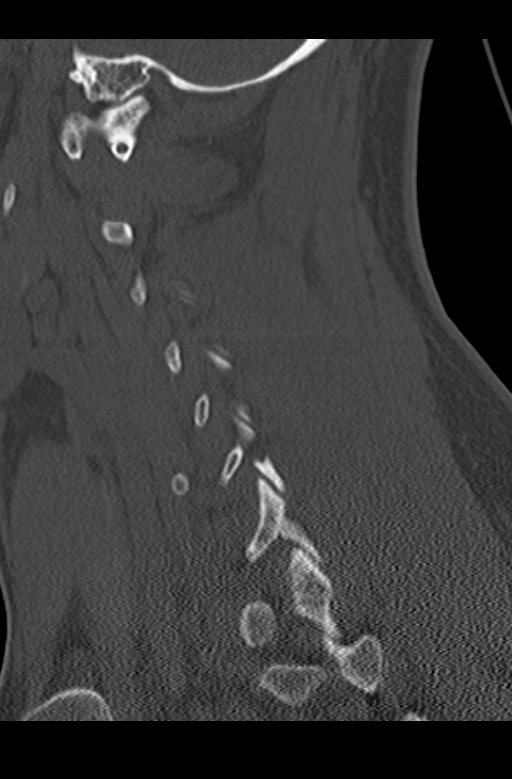

[13 of 33 positions shown; findings below may reference images not displayed]

FINDINGS: CT HEAD FINDINGS

The ventricles are normal in size and configuration. There is no
intracranial mass, hemorrhage, extra-axial fluid collection, or
midline shift. The gray-white compartments are normal. No acute
infarct evident. Bony calvarium appears intact. The mastoid air
cells are clear. No intraorbital lesions are identified.

CT CERVICAL SPINE FINDINGS

There is no fracture or spondylolisthesis. Prevertebral soft tissues
and predental space regions are normal. The disc spaces appear
normal. There is no nerve root edema or effacement. No disc
extrusion or stenosis.
IMPRESSION: CT head:  Study within normal limits.

CT cervical spine: No fracture or spondylolisthesis. No appreciable
arthropathic change.
